# Patient Record
Sex: Female | Born: 1970 | Race: Black or African American | Hispanic: No | Marital: Single | Smoking: Never smoker
Health system: Southern US, Community
[De-identification: ages and names within clinical notes are randomized; demographics above are authoritative.]

## PROBLEM LIST (undated history)

## (undated) DIAGNOSIS — B019 Varicella without complication: Secondary | ICD-10-CM

## (undated) DIAGNOSIS — I340 Nonrheumatic mitral (valve) insufficiency: Secondary | ICD-10-CM

## (undated) DIAGNOSIS — G43909 Migraine, unspecified, not intractable, without status migrainosus: Secondary | ICD-10-CM

## (undated) DIAGNOSIS — D500 Iron deficiency anemia secondary to blood loss (chronic): Secondary | ICD-10-CM

## (undated) DIAGNOSIS — T7840XA Allergy, unspecified, initial encounter: Secondary | ICD-10-CM

## (undated) DIAGNOSIS — Q2112 Patent foramen ovale: Secondary | ICD-10-CM

## (undated) DIAGNOSIS — D509 Iron deficiency anemia, unspecified: Principal | ICD-10-CM

## (undated) DIAGNOSIS — I071 Rheumatic tricuspid insufficiency: Secondary | ICD-10-CM

## (undated) DIAGNOSIS — Z531 Procedure and treatment not carried out because of patient's decision for reasons of belief and group pressure: Secondary | ICD-10-CM

## (undated) DIAGNOSIS — I639 Cerebral infarction, unspecified: Secondary | ICD-10-CM

## (undated) DIAGNOSIS — N946 Dysmenorrhea, unspecified: Secondary | ICD-10-CM

## (undated) DIAGNOSIS — R0602 Shortness of breath: Secondary | ICD-10-CM

## (undated) DIAGNOSIS — Q211 Atrial septal defect: Secondary | ICD-10-CM

## (undated) DIAGNOSIS — Z8673 Personal history of transient ischemic attack (TIA), and cerebral infarction without residual deficits: Secondary | ICD-10-CM

## (undated) DIAGNOSIS — IMO0001 Reserved for inherently not codable concepts without codable children: Secondary | ICD-10-CM

## (undated) HISTORY — DX: Varicella without complication: B01.9

## (undated) HISTORY — DX: Cerebral infarction, unspecified: I63.9

## (undated) HISTORY — DX: Personal history of transient ischemic attack (TIA), and cerebral infarction without residual deficits: Z86.73

## (undated) HISTORY — DX: Iron deficiency anemia, unspecified: D50.9

## (undated) HISTORY — DX: Allergy, unspecified, initial encounter: T78.40XA

## (undated) HISTORY — DX: Rheumatic tricuspid insufficiency: I07.1

## (undated) HISTORY — DX: Patent foramen ovale: Q21.12

## (undated) HISTORY — DX: Iron deficiency anemia secondary to blood loss (chronic): D50.0

## (undated) HISTORY — DX: Atrial septal defect: Q21.1

## (undated) HISTORY — DX: Nonrheumatic mitral (valve) insufficiency: I34.0

## (undated) HISTORY — DX: Migraine, unspecified, not intractable, without status migrainosus: G43.909

## (undated) HISTORY — DX: Dysmenorrhea, unspecified: N94.6

---

## 1999-05-18 ENCOUNTER — Other Ambulatory Visit: Admission: RE | Admit: 1999-05-18 | Discharge: 1999-05-18 | Payer: Self-pay | Admitting: Obstetrics & Gynecology

## 2000-07-10 ENCOUNTER — Emergency Department (HOSPITAL_COMMUNITY): Admission: EM | Admit: 2000-07-10 | Discharge: 2000-07-10 | Payer: Self-pay | Admitting: *Deleted

## 2002-03-13 ENCOUNTER — Other Ambulatory Visit: Admission: RE | Admit: 2002-03-13 | Discharge: 2002-03-13 | Payer: Self-pay | Admitting: Obstetrics and Gynecology

## 2003-09-04 ENCOUNTER — Other Ambulatory Visit: Admission: RE | Admit: 2003-09-04 | Discharge: 2003-09-04 | Payer: Self-pay | Admitting: Obstetrics and Gynecology

## 2004-04-29 ENCOUNTER — Encounter: Admission: RE | Admit: 2004-04-29 | Discharge: 2004-04-29 | Payer: Self-pay | Admitting: Obstetrics and Gynecology

## 2004-05-12 ENCOUNTER — Inpatient Hospital Stay (HOSPITAL_COMMUNITY): Admission: AD | Admit: 2004-05-12 | Discharge: 2004-05-14 | Payer: Self-pay | Admitting: Obstetrics and Gynecology

## 2006-02-22 ENCOUNTER — Other Ambulatory Visit: Admission: RE | Admit: 2006-02-22 | Discharge: 2006-02-22 | Payer: Self-pay | Admitting: Obstetrics and Gynecology

## 2010-01-29 ENCOUNTER — Observation Stay (HOSPITAL_COMMUNITY): Admission: EM | Admit: 2010-01-29 | Discharge: 2010-01-29 | Payer: Self-pay | Admitting: Emergency Medicine

## 2010-01-29 ENCOUNTER — Ambulatory Visit: Payer: Self-pay | Admitting: Cardiovascular Disease

## 2010-01-29 ENCOUNTER — Encounter (INDEPENDENT_AMBULATORY_CARE_PROVIDER_SITE_OTHER): Payer: Self-pay | Admitting: Emergency Medicine

## 2010-10-10 LAB — BASIC METABOLIC PANEL
BUN: 10 mg/dL (ref 6–23)
Chloride: 105 mEq/L (ref 96–112)
Creatinine, Ser: 0.86 mg/dL (ref 0.4–1.2)
GFR calc Af Amer: >60 mL/min (ref >60)
Potassium: 3.9 mEq/L (ref 3.5–5.1)

## 2010-10-10 LAB — DIFFERENTIAL
Basophils Absolute: 0 10*3/uL (ref 0.0–0.1)
Eosinophils Relative: 3 % (ref 0–5)
Lymphocytes Relative: 37 % (ref 12–46)
Lymphs Abs: 2.2 10*3/uL (ref 0.7–4.0)
Neutrophils Relative %: 52 % (ref 43–77)

## 2010-10-10 LAB — POCT CARDIAC MARKERS
CKMB, poc: <1 ng/mL — ABNORMAL LOW (ref 1.0–8.0)
CKMB, poc: <1 ng/mL — ABNORMAL LOW (ref 1.0–8.0)
Myoglobin, poc: 24.2 ng/mL (ref 12–200)
Myoglobin, poc: 38.9 ng/mL (ref 12–200)

## 2010-10-10 LAB — CBC
Hemoglobin: 11.5 g/dL — ABNORMAL LOW (ref 12.0–15.0)
MCV: 78.2 fL (ref 78.0–100.0)
RBC: 4.61 MIL/uL (ref 3.87–5.11)
RDW: 15.6 % — ABNORMAL HIGH (ref 11.5–15.5)
WBC: 5.8 10*3/uL (ref 4.0–10.5)

## 2010-10-19 ENCOUNTER — Encounter (INDEPENDENT_AMBULATORY_CARE_PROVIDER_SITE_OTHER): Payer: BC Managed Care – PPO | Admitting: Surgery

## 2010-10-19 DIAGNOSIS — Q211 Atrial septal defect: Secondary | ICD-10-CM

## 2010-10-20 NOTE — Consult Note (Signed)
NEW PATIENT CONSULTATION  Morgan Shaffer, Morgan Shaffer DOB:  [birth year]                                        October 19, 2010 CHART #:  [id number]  REASON FOR CONSULTATION:  PFO or ASD with history of TIAs.  CLINICAL HISTORY:  I was asked by Dr. Wynelle Link to evaluate the patient for treatment options of a possible small PFO or ASD with a history of 2 prior TIAs.  She is a 40 year old Jehovah's Witness who reports a history of migraine headaches since she was about 40 years old that had been worse over the past few years, occurring about once a month.  She reports that in May 2010 she had a TIA while sitting at the table with her family at dinner.  She was trying to drink from a glass and the liquid was pouring down over her chin.  She said she had difficulty speaking and tingling and numbness in her left upper and lower extremity.  She said she tried to get some words out to tell her daughter to call 911, but her speech was garbled.  Her symptoms gradually improved.  She had a second episode in March 2011 which was similar.  She was evaluated with a head CT at that time which apparently showed a small right cerebellar infarct.  Since then she said that she has had occasional episodes when she has not felt quite right but can put her finger on it.  She says she has pulled over while driving her car a few times with some tingling on the left side of her body.  She was seen in February by Dr. Wynelle Link for a new patient annual health review and was sent for an echocardiogram.  This showed normal left ventricular size and function.  Ejection fraction was 55%.  There was mild mitral regurgitation and mild tricuspid regurgitation.  There was no aortic stenosis or insufficiency.  The right and left atrium were of normal size.  There appeared to be a small PFO or ASD with a positive contrast bubble study.  REVIEW OF SYSTEMS:  GENERAL:  She denies any fever or chills.  She denies any recent  weight changes.  She denies fatigue. EYES:  She wears contacts.  She has had no recent visual changes. ENT:  She does have some ringing in her ears at times.  She has had no hearing loss. ENDOCRINE:  She denies diabetes and hypothyroidism. CARDIOVASCULAR:  She does have occasional chest discomfort but is vague. It has not been related to exertion.  She denies dyspnea on exertion. She has had no PND or orthopnea.  She denies peripheral edema and palpitations. RESPIRATORY:  She denies cough and sputum production. GI:  She has had no nausea or vomiting.  She denies melena and bright red blood per rectum. GU:  She denies dysuria and hematuria. MUSCULOSKELETAL:  She denies arthralgias and myalgias. VASCULAR:  She denies any claudication or phlebitis.  She never had DVT. NEUROLOGIC:  As above.  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  She denies any ongoing weakness or numbness.  She denies any visual changes. MUSCULOSKELETAL:  She denies arthralgias and myalgias. HEMATOLOGIC:  She denies any bleeding disorders or easy bleeding.  ALLERGIES:  ASPIRIN which causes difficulty breathing and tongue swelling.  PRESCRIBED MEDICATIONS: 1. Lopressor  12.5 mg b.i.d. 2. Plavix 75 mg daily. 3. Lipitor 10 mg daily. She said that she stopped all of these medications due to suspected side effects.  PAST MEDICAL HISTORY:  Significant for asthma.  She has a history of migraine headaches as noted above.  She has history of TIA and stroke as noted above.  She has never had any surgery.  SOCIAL HISTORY:  She is Jehovah Witness.  She is married and has 2 children.  She has never smoked.  She rarely drinks alcohol.  She denies any drug use.  FAMILY HISTORY:  Positive for hypertension.  PHYSICAL EXAMINATION:  VITAL SIGNS:  Her blood pressure is 134/90, pulse 78 and regular, respiratory rate is 14 and unlabored.  Oxygen saturation on room air is 99%.  GENERAL:  She is a  well-developed [place] American woman in no distress.  HEENT:  Normocephalic and atraumatic.  Pupils are equal and reactive to light and accommodation.  Extraocular muscles are intact.  Oropharynx is clear.  NECK:  Normal carotid pulses bilaterally. There are no bruits.  There is no adenopathy or thyromegaly.  CARDIAC: Regular rate and rhythm with normal S1 and S2.  There is no murmur, rub, or gallop.  LUNGS:  Clear.  ABDOMEN:  Active bowel sounds.  Abdomen is soft and nontender.  There are no palpable masses or organomegaly. EXTREMITIES:  No peripheral edema.  Pedal pulses are palpable bilaterally.  SKIN:  Warm and dry.  NEUROLOGIC:  Alert and oriented x3. Motor and sensory exams are grossly normal.  Recent laboratory examination shows a hemoglobin of 9.0 with a hematocrit 28.9 and platelet count 357,000.  Electrolytes were normal with BUN of 8 and creatinine 0.86.  IMPRESSION:  The patient small patent foramen ovale or atrial septal defect with a positive bubble study on 2-D echocardiogram with a history of 2 prior transient ischemic attacks and prior stroke noted on CT scan in March 2011.  She also has a history of migraine headaches since as teenager that had been worse over the past few years.  I think that further workup and treatment for possible patent foramen ovale or atrial septal defect is indicated given her history.  We will obtain a transesophageal echocardiogram to further evaluate the interatrial septum and allow precise determination of whether this is a patent foramen ovale or ventricular septal defect and its measurements as well as measurement of the surrounding septum.  I have scheduled this with Dr. Donato Schultz.  Depending on the results of the TEE, I will consider referring her to Dr. [name] for consideration of percutaneous closure of the patent foramen ovale or atrial septal defect.  If she is not a candidate for percutaneous procedure, then she can be  referred to Dr. Cornelius Moras for evaluation for surgical closure by minimally invasive mini right thoracotomy approach.  I discussed all of these with the patient in detail and answered all of her questions and she is in full agreement with that plan.  I will plan to see her back in the office after her TEE is completed.  Evelene Croon, M.D. Electronically Signed  BB/MEDQ  D:  10/19/2010  T:  10/20/2010  Job:  [id number]  cc:   Deatra James, M.D. Jake Bathe, MD

## 2010-10-26 ENCOUNTER — Ambulatory Visit (HOSPITAL_COMMUNITY)
Admission: RE | Admit: 2010-10-26 | Discharge: 2010-10-26 | Disposition: A | Discharge status: Home / Self Care | Payer: BC Managed Care – PPO | Source: Ambulatory Visit | Attending: Cardiology | Admitting: Cardiology

## 2010-10-26 DIAGNOSIS — I6350 Cerebral infarction due to unspecified occlusion or stenosis of unspecified cerebral artery: Secondary | ICD-10-CM | POA: Insufficient documentation

## 2010-10-26 DIAGNOSIS — I059 Rheumatic mitral valve disease, unspecified: Secondary | ICD-10-CM | POA: Insufficient documentation

## 2010-10-26 DIAGNOSIS — I079 Rheumatic tricuspid valve disease, unspecified: Secondary | ICD-10-CM | POA: Insufficient documentation

## 2010-11-09 ENCOUNTER — Ambulatory Visit: Payer: BC Managed Care – PPO | Admitting: Surgery

## 2010-11-12 ENCOUNTER — Ambulatory Visit (INDEPENDENT_AMBULATORY_CARE_PROVIDER_SITE_OTHER): Payer: BC Managed Care – PPO | Admitting: Surgery

## 2010-11-12 DIAGNOSIS — Q211 Atrial septal defect: Secondary | ICD-10-CM

## 2010-11-13 NOTE — Assessment & Plan Note (Signed)
OFFICE VISIT  Morgan Shaffer, Morgan Shaffer DOB:  1971/04/30                                        November 12, 2010 CHART #:  [id number]  The patient returned to my office today for followup following a transesophageal echocardiogram to further evaluate a PFO or ASD with a history of 2 prior TIAs and a history of prior stroke documented on head CT.  Please see my initial consultation from October 19, 2010, for her full history.  She underwent a transesophageal echocardiogram on April 3 by Dr. Anne Fu.  I have reviewed this.  It does show a small PFO with a positive bubble contrast study with a small amount of bubble contrast passing through the PFO only with coughing.  There was no evidence of atrial septal defect.  There was mild mitral regurgitation and no other abnormalities noted.  There were no vegetations noted and no intracardiac thrombus.  I reviewed the results with her.  I discussed the potential options of percutaneous closure by Interventional Cardiology as well as minimally invasive surgical closure.  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  I will refer her to Dr. [name] for evaluation for percutaneous closure.  If he feels that this is not possible or advisable, then she could be reevaluated for minimally invasive surgical closure.  Given her history of 2 prior TIAs and documented cerebellar stroke on CT scan with a positive bubble study, I think it would be best to pursue closure of this PFO.  I discussed all of these with her and she is in agreement.  Evelene Croon, M.D. Electronically Signed  BB/MEDQ  D:  11/12/2010  T:  11/13/2010  Job:  [id number]  cc:   Deatra James, M.D. Veverly Fells. Excell Seltzer, MD

## 2010-11-23 ENCOUNTER — Encounter: Payer: Self-pay | Admitting: Cardiovascular Disease

## 2010-11-24 ENCOUNTER — Encounter: Payer: Self-pay | Admitting: Cardiovascular Disease

## 2010-11-24 ENCOUNTER — Ambulatory Visit (INDEPENDENT_AMBULATORY_CARE_PROVIDER_SITE_OTHER): Payer: BC Managed Care – PPO | Admitting: Cardiovascular Disease

## 2010-11-24 VITALS — BP 110/68 | HR 67 | Resp 18 | Ht 65.0 in | Wt 157.0 lb

## 2010-11-24 DIAGNOSIS — Q211 Atrial septal defect: Secondary | ICD-10-CM

## 2010-11-24 DIAGNOSIS — Q2112 Patent foramen ovale: Secondary | ICD-10-CM

## 2010-11-24 NOTE — Assessment & Plan Note (Addendum)
The patient's transesophageal echo images were reviewed. She has a tunneled PFO and prominent eustachian valve. The eustachian valve can direct flow across the foramen ovale and has been implicated in orthodeoxia-platypnea syndrome.  The patient's neurologic events were very compelling for TIAs and she also had radiographic evidence of such with an abnormal CT of the brain. In a young patient without other risk factors for stroke, especially with recurrent events, I think transcatheter closure is indicated. I reviewed the risks, indications, and alternatives with the patient and she would like to proceed. She understands that she will have to take Plavix for a period of 6 months. Risks include bleeding, infection, device embolization, stroke, MI, cardiac perforation and tamponade, and late erosion.  These were all reviewed in detail and the patient understands.  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  She understands this additional risk.

## 2010-11-24 NOTE — Patient Instructions (Signed)
Your physician recommends PFO closure.  Our office will contact you with instructions.

## 2010-11-24 NOTE — Progress Notes (Signed)
HPI:  This is a 40 year old woman referred for consideration of transcatheter PFO closure. The patient has a long-standing history of migraine headaches, but she never had significant neurologic symptoms until approximately 2 years ago when she developed the acute onset of expressive aphasia and left-sided weakness. She was sitting at the table eating dinner when she went to take a drink from her cup. Her drink spilled down from her mouth. She went to call out to her daughter but was unable to speak. She also had left arm weakness.  The total duration of symptoms was approximately 5-10 minutes. The patient was hospitalized and told that she may have had a complicated migraine. She sought second neurologist who brought up the possibility of a PFO as he thought her symptoms were more likely related to a TIA. The patient took Plavix for a period of about 6 months but then discontinued all of her medications because of several side effects. She has previously been unable to take aspirin because of an aspirin allergy. She then had a second episode in March 2011. She reports similar symptoms when she had aphasia and left-sided tingling and weakness again.  At that time a CT of the brain showed a small right cerebellar infarct. She ultimately underwent an echo with bubble study that demonstrated a right to left shunt.  The patient was referred to Dr. Lavinia Sharps with cardiac surgery and after evaluation he ordered a transesophageal echo. This demonstrated normal left ventricular size and systolic function with an ejection fraction of 65-70%. There was a tunneled atrial septal communication consistent with a PFO and right to left shunt was seen with Valsalva. On my review of the films there also appears to be a prominent eustachian valve.  The patient has been off of all medications. She has had no further TIA symptoms since last year. She would like to avoid long-term medication if at all possible and she has a strong desire  for closure of her atrial septal communication.  Outpatient Encounter Prescriptions as of 11/24/2010  Medication Sig Dispense Refill  . Multiple Vitamin (MULTIVITAMIN) tablet Take 1 tablet by mouth daily.        Marland Kitchen DISCONTD: atorvastatin (LIPITOR) 10 MG tablet Take 10 mg by mouth daily.        Marland Kitchen DISCONTD: clopidogrel (PLAVIX) 75 MG tablet Take 75 mg by mouth daily.        Marland Kitchen DISCONTD: metoprolol tartrate (LOPRESSOR) 25 MG tablet Take by mouth. Take 1/2 tablet twice a day         Aspirin and Lisinopril  Past Medical History  Diagnosis Date  . History of TIAs   . Stroke   . PFO (patent foramen ovale)   . Mitral regurgitation     mild  . Migraine headache   . Mild tricuspid regurgitation   . Asthma     No past surgical history on file.  History   Social History  . Marital Status: Single    Spouse Name: N/A    Number of Children: 2  . Years of Education: N/A   Occupational History  .        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   Social History Main Topics  . Smoking status: Never Smoker   . Smokeless tobacco: Not on file  . Alcohol Use: No  . Drug Use: No  . Sexually Active: Not on file   Other Topics Concern  . Not on file   Social History Narrative  . No narrative  on file    Family History  Problem Relation Age of Onset  . Hypertension      positive family hx of    ROS: General: no fevers/chills/night sweats Eyes: no blurry vision, diplopia, or amaurosis ENT: no sore throat or hearing loss Resp: no cough, wheezing, or hemoptysis CV: no edema or palpitations GI: no abdominal pain, nausea, vomiting, diarrhea, or constipation GU: no dysuria, frequency, or hematuria Skin: no rash Neuro: no headache, numbness, tingling, or weakness of extremities Musculoskeletal: no joint pain or swelling Heme: no bleeding, DVT, or easy bruising Endo: no polydipsia or polyuria  BP 110/68  Pulse 67  Resp 18  Ht 5\' 5"  (1.651 m)  Wt 157 lb (71.215 kg)  BMI 26.13 kg/m2  PHYSICAL  EXAM: Pt is alert and oriented, healthy appearing young woman, WD, WN, in no distress. HEENT: normal Neck: JVP normal. Carotid upstrokes normal without bruits. No thyromegaly. Lungs: equal expansion, clear bilaterally CV: Apex is discrete and nondisplaced, RRR without murmur or gallop Abd: soft, NT, +BS, no bruit, no hepatosplenomegaly Back: no CVA tenderness Ext: no C/C/E        Femoral pulses 2+= without bruits        DP/PT pulses intact and = Skin: warm and dry without rash Neuro: CNII-XII intact             Strength intact = bilaterally  EKG:  Normal sinus rhythm 69 beats per minute, within normal limits.  ASSESSMENT AND PLAN:
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
## 2010-12-27 ENCOUNTER — Telehealth: Payer: Self-pay | Admitting: Cardiovascular Disease

## 2010-12-27 NOTE — Telephone Encounter (Signed)
The patient's PFO closure has been denied by her insurance. I did a peer-to-peer review today with [employer] and will be writing a letter of appeal. I notified the patient by telephone and we discussed the situation. She understands and will do an appeal as well.

## 2011-01-10 ENCOUNTER — Encounter: Payer: Self-pay | Admitting: Cardiovascular Disease

## 2011-03-22 ENCOUNTER — Inpatient Hospital Stay (HOSPITAL_COMMUNITY): Payer: BC Managed Care – PPO

## 2011-03-22 ENCOUNTER — Emergency Department (HOSPITAL_COMMUNITY): Payer: BC Managed Care – PPO

## 2011-03-22 ENCOUNTER — Inpatient Hospital Stay (HOSPITAL_COMMUNITY)
Admission: EM | Admit: 2011-03-22 | Discharge: 2011-03-24 | DRG: 832 | Disposition: A | Discharge status: Home / Self Care | Payer: BC Managed Care – PPO | Attending: Internal Medicine | Admitting: Internal Medicine

## 2011-03-22 DIAGNOSIS — D509 Iron deficiency anemia, unspecified: Secondary | ICD-10-CM | POA: Diagnosis present

## 2011-03-22 DIAGNOSIS — Z886 Allergy status to analgesic agent status: Secondary | ICD-10-CM

## 2011-03-22 DIAGNOSIS — Z8673 Personal history of transient ischemic attack (TIA), and cerebral infarction without residual deficits: Secondary | ICD-10-CM

## 2011-03-22 DIAGNOSIS — G459 Transient cerebral ischemic attack, unspecified: Principal | ICD-10-CM | POA: Diagnosis present

## 2011-03-22 DIAGNOSIS — Q211 Atrial septal defect: Secondary | ICD-10-CM

## 2011-03-22 DIAGNOSIS — Q2111 Secundum atrial septal defect: Secondary | ICD-10-CM

## 2011-03-22 LAB — URINALYSIS, ROUTINE W REFLEX MICROSCOPIC
Bilirubin Urine: NEGATIVE
Glucose, UA: NEGATIVE mg/dL
Protein, ur: NEGATIVE mg/dL
Specific Gravity, Urine: 1.015 (ref 1.005–1.030)

## 2011-03-22 LAB — TROPONIN I
Troponin I: <0.3 ng/mL (ref <0.30)
Troponin I: <0.3 ng/mL (ref <0.30)

## 2011-03-22 LAB — RAPID URINE DRUG SCREEN, HOSP PERFORMED
Amphetamines: NOT DETECTED
Barbiturates: NOT DETECTED
Benzodiazepines: NOT DETECTED
Cocaine: NOT DETECTED

## 2011-03-22 LAB — APTT
aPTT: 31 seconds (ref 24–37)
aPTT: 27 seconds (ref 24–37)

## 2011-03-22 LAB — CBC
HCT: 29.2 % — ABNORMAL LOW (ref 36.0–46.0)
Hemoglobin: 8.5 g/dL — ABNORMAL LOW (ref 12.0–15.0)
MCH: 18.6 pg — ABNORMAL LOW (ref 26.0–34.0)
MCHC: 29.5 g/dL — ABNORMAL LOW (ref 30.0–36.0)
MCV: 62.8 fL — ABNORMAL LOW (ref 78.0–100.0)
Platelets: 302 10*3/uL (ref 150–400)
RBC: 4.62 MIL/uL (ref 3.87–5.11)
RBC: 4.62 MIL/uL (ref 3.87–5.11)
RDW: 20.2 % — ABNORMAL HIGH (ref 11.5–15.5)
WBC: 5.2 10*3/uL (ref 4.0–10.5)
WBC: 5.5 10*3/uL (ref 4.0–10.5)

## 2011-03-22 LAB — PROTIME-INR
INR: 1.08 (ref 0.00–1.49)
Prothrombin Time: 14.2 seconds (ref 11.6–15.2)
Prothrombin Time: 14.4 seconds (ref 11.6–15.2)

## 2011-03-22 LAB — HEMOGLOBIN A1C: Hgb A1c MFr Bld: 6 % — ABNORMAL HIGH (ref <5.7)

## 2011-03-22 LAB — POCT I-STAT, CHEM 8
BUN: 7 mg/dL (ref 6–23)
Glucose, Bld: 97 mg/dL (ref 70–99)
HCT: 32 % — ABNORMAL LOW (ref 36.0–46.0)
Hemoglobin: 10.9 g/dL — ABNORMAL LOW (ref 12.0–15.0)
Sodium: 141 mEq/L (ref 135–145)

## 2011-03-22 LAB — COMPREHENSIVE METABOLIC PANEL
ALT: 6 U/L (ref 0–35)
AST: 14 U/L (ref 0–37)
Alkaline Phosphatase: 116 U/L (ref 39–117)
CO2: 21 mEq/L (ref 19–32)
GFR calc Af Amer: >60 mL/min (ref >60)
GFR calc non Af Amer: >60 mL/min (ref >60)
Glucose, Bld: 91 mg/dL (ref 70–99)
Potassium: 4 mEq/L (ref 3.5–5.1)
Sodium: 138 mEq/L (ref 135–145)

## 2011-03-22 LAB — ETHANOL: Alcohol, Ethyl (B): <11 mg/dL (ref 0–11)

## 2011-03-22 NOTE — H&P (Signed)
NAMECADEY, BAZILE NO.:  [id number]  MEDICAL RECORD NO.:  [id number]  LOCATION:  [facility]                         FACILITY:  [facility]  PHYSICIAN:  Andreas Blower, MD       DATE OF BIRTH:  1971/02/12  DATE OF ADMISSION:  03/22/2011 DATE OF DISCHARGE:                             HISTORY & PHYSICAL   PRIMARY CARE PHYSICIAN:  Deatra James, MD  CARDIOTHORACIC SURGEON:  Evelene Croon, MD  CARDIOLOGIST:  Veverly Fells. Excell Seltzer, MD  CHIEF COMPLAINTS:  Right upper and lower extremity weakness, confusion.  HISTORY OF PRESENT ILLNESS:  Ms. Kuras is a 40 year old African American female with history of TIAs x2, patent foramen ovale, allergy to aspirin who presents with the above complaints.  She reported that her symptoms started around 10:30 a.m. with blurry vision in the left eye and some dizziness.  When she laid down to go to bed, she noted ringing in her right ear.  When she woke up this morning, she continued to have persistent ringing in a right ear, but was improved.  She also noted some numbness in her right scalp when she was brushing her hair this morning at 6:00 a.m. and also noted some weakness in her right upper and lower extremity.  She went to work and continued to have the symptoms with dizziness as a result presented to the ER for further evaluation. At around 10:30 a.m., her symptoms improved.  The patient had a head CT which was negative, but the hospitalist service was asked to admit the patient given that she has had TIA symptoms x2 a few years ago in [place], [place].  The patient moved from [place] to [place] in April 2011.  The patient has been seeing Dr. Laneta Simmers, Thoracic Surgery for consideration of closure of PFO, but has been delayed due to insurance reasons.  The patient denies any recent fevers, chills, nausea, vomiting.  Denies any chest pain, shortness of breath.  Denies any abdominal pain, diarrhea.  Denies any headaches or vision  changes presently.  REVIEW OF SYSTEMS:  All systems were reviewed with the patient, was positive as per HPI, otherwise all other systems are negative.  PAST MEDICAL HISTORY: 1. History of TIAs x2 a few years ago. 2. 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 3. Questionable history of hypertension, was started on     antihypertensive medication after first TIA; however, was     discontinued after the second TIA. 4. Questionable history of hyperlipidemia, was started on statin after     the first TIAs, but was continued after the second TIA. 5. Patent foramen ovale, in the process of being evaluated by Dr.     Laneta Simmers for closure of PFO.  SOCIAL HISTORY:  The patient does not smoke, drinks alcohol once or twice a month.  Lives at home with children.  Works in the accounts payable.  FAMILY HISTORY:  Mother had a arrhythmia, otherwise no significant family history.  HOME MEDICATIONS:  Currently not taking any medications at this time.  PHYSICAL EXAMINATION:  VITAL SIGNS:  Temperature 98.4, blood pressure 138/86, heart rate 87, respirations 18, satting 100% on room air. GENERAL:  The patient was alert,  oriented, not in acute distress, was lying in bed comfortably. HEENT:  Extraocular motions are intact.  Pupils are equal, round.  Had moist mucous membranes. NECK:  Supple. HEART:  Regular with S1 and S2. LUNGS:  Clear to auscultation bilaterally. ABDOMEN: Soft, nontender, nondistended.  Positive bowel sounds. EXTREMITIES:  The patient had good peripheral pulses with trace edema. NEUROLOGIC:  Cranial nerves II through XII grossly intact, had 5/5 motor strength in upper as well as lower extremities.  RADIOLOGY/IMAGING:  The patient had head CT which was normal, head CT without contrast.  LABORATORY DATA:  CBC shows a white count of 5.2, hemoglobin 10.9, hematocrit 32.0, platelet count 302.  Electrolytes normal with a BUN of 7, creatinine 0.80.  Initial troponin less than 0.30.  UA is  negative for nitrites and leukocytes.  Blood alcohol level less than 11.  Urine pregnancy is negative.  ASSESSMENT AND PLAN: 1. Transient ischemic attack suspect that this is a recurrence of her     TIA.  We will get an MRI, MRA.  Will get a 2-D echocardiogram,     carotid Dopplers as part of TIA workup.  We will start the patient     on Plavix as she reports that she herself has discontinued Plavix     about a year ago in the past.  Probably, we will need to contact     Neurology to determine if Plavix should be sufficient for the     patient at the time of discharge.  This should be sufficient at the     time of discharge if the workup is negative.  We will not have her     on any aspirin as she has had anaphylaxis from aspirin usage. 2. Questionable diagnosis of hypertension, currently not on any     hypertensive medications and monitor her blood pressure during the     course of hospital stay.  We will allow permissive hypertension for     the next 24 hours.  If the patient has persistent elevated blood     pressure, may benefit from being on antihypertensive medications. 3. Questionable diagnosis of hyperlipidemia.  We will send for lipid     panel in the morning. 4. Patent foramen ovale.  Further management as outpatient under the     care of Dr. Laneta Simmers for consideration of closure. 5. Anemia.  We will send for anemia panel. 6. Prophylaxis, Lovenox for DVT prophylaxis. 7. Code status.  The patient is full code.  Time spent on admission, talking to the patient, coordinating care was 45 minutes.   Addendum: Spoke with neurology, if the workup is negative could be discharge on plavix.   Andreas Blower, MD   SR/MEDQ  D:  03/22/2011  T:  03/22/2011  Job:  [id number]  Electronically Signed by Wardell Heath Glennda Weatherholtz  on 03/22/2011 11:47:24 PM

## 2011-03-23 DIAGNOSIS — G459 Transient cerebral ischemic attack, unspecified: Secondary | ICD-10-CM

## 2011-03-23 LAB — FOLATE: Folate: 11 ng/mL

## 2011-03-23 LAB — LIPID PANEL
LDL Cholesterol: 89 mg/dL (ref 0–99)
Triglycerides: 103 mg/dL (ref <150)
VLDL: 21 mg/dL (ref 0–40)

## 2011-03-23 LAB — BASIC METABOLIC PANEL
BUN: 10 mg/dL (ref 6–23)
CO2: 21 mEq/L (ref 19–32)
Calcium: 9.3 mg/dL (ref 8.4–10.5)
Creatinine, Ser: 0.81 mg/dL (ref 0.50–1.10)
GFR calc non Af Amer: >60 mL/min (ref >60)
Glucose, Bld: 97 mg/dL (ref 70–99)
Sodium: 137 mEq/L (ref 135–145)

## 2011-03-23 LAB — CBC
Hemoglobin: 8.4 g/dL — ABNORMAL LOW (ref 12.0–15.0)
MCH: 18.1 pg — ABNORMAL LOW (ref 26.0–34.0)
MCHC: 28.9 g/dL — ABNORMAL LOW (ref 30.0–36.0)
MCV: 62.9 fL — ABNORMAL LOW (ref 78.0–100.0)
RBC: 4.63 MIL/uL (ref 3.87–5.11)

## 2011-03-23 LAB — IRON AND TIBC: Iron: 11 ug/dL — ABNORMAL LOW (ref 42–135)

## 2011-03-23 LAB — VITAMIN B12: Vitamin B-12: 616 pg/mL (ref 211–911)

## 2011-03-23 LAB — FERRITIN: Ferritin: 5 ng/mL — ABNORMAL LOW (ref 10–291)

## 2011-03-24 LAB — BASIC METABOLIC PANEL
BUN: 11 mg/dL (ref 6–23)
Creatinine, Ser: 0.71 mg/dL (ref 0.50–1.10)
GFR calc Af Amer: >60 mL/min (ref >60)
GFR calc non Af Amer: >60 mL/min (ref >60)
Glucose, Bld: 97 mg/dL (ref 70–99)
Potassium: 3.9 mEq/L (ref 3.5–5.1)

## 2011-03-24 LAB — CBC
HCT: 30.9 % — ABNORMAL LOW (ref 36.0–46.0)
Hemoglobin: 9.1 g/dL — ABNORMAL LOW (ref 12.0–15.0)
MCH: 18.7 pg — ABNORMAL LOW (ref 26.0–34.0)
MCHC: 29.4 g/dL — ABNORMAL LOW (ref 30.0–36.0)
MCV: 63.6 fL — ABNORMAL LOW (ref 78.0–100.0)
RDW: 20.4 % — ABNORMAL HIGH (ref 11.5–15.5)

## 2011-03-31 NOTE — Discharge Summary (Signed)
Morgan Shaffer, Morgan Shaffer NO.:  [id number]  MEDICAL RECORD NO.:  [id number]  LOCATION:  3032                         FACILITY:  [facility]  PHYSICIAN:  Ramiro Harvest, MD    DATE OF BIRTH:  01/06/1971  DATE OF ADMISSION:  03/22/2011 DATE OF DISCHARGE:  03/24/2011                        DISCHARGE SUMMARY    PRIMARY CARE PHYSICIAN:  Dr. Naida Sleight with [facility].  CARDIOTHORACIC SURGEON:  Evelene Croon, MD  DISCHARGE DIAGNOSES: 1. Transient ischemic attack, symptoms resolved. 2. Iron-deficiency anemia. 3. Patent foraminal valve in the process of being evaluated by Dr.     Laneta Simmers for closure of patent foramen ovale. 4. History of transient ischemic attack x2. 5. History of anaphylactic reaction to aspirin. 6. History of remote very small right cerebellar infarct per MRI of     March 23, 2011.  DISCHARGE MEDICATIONS: 1. Plavix 75 mg p.o. daily. 2. Nu-Iron 150 mg p.o. daily. 3. Pravachol 20 mg p.o. daily. 4. Zofran 4 mg 1-2 tablets p.o. q.4 h. P.r.n. 5. Multivitamin 1 tablet p.o. daily. 6. Tylenol 325 mg 1-2 tablets p.o. q.6 h. p.r.n. pain.  DISPOSITION AND FOLLOWUP:  The patient will be discharged home on Plavix.  The patient does not have any PT, OT needs.  The patient is to follow up with PCP 1 week post discharge.  On follow up, a CBC will need to be checked and followup on the patient's H and H.  The patient is also to follow up with Dr. Laneta Simmers for closure of PFO.  The patient has started on Nu-Iron 150 mg daily secondary to iron-deficiency anemia and this will need to be followed up upon per PCP.  CONSULTATIONS DONE:  None.  PROCEDURES PERFORMED:  CT of the head without contrast was done March 22, 2011 that showed normal CT of the head without contrast.  MRI, MRA of the head was done March 23, 2011 that showed no acute infarct remote, very small right cerebellar infarct.  No evidence of medium or large size vessel significant narrowing or  occlusion as noted above.  A carotid Dopplers were done on March 23, 2011 that showed no significant extracranial carotid artery stenosis.  Vertebrals are patent with antegrade flow.  A 2-D echo was done on March 23, 2011 that showed a normal left ventricular cavity size, systolic function was normal, EF 55- 60%, wall motion was normal.  There was no regional wall motion abnormalities.  No cardiac source of emboli was identified.  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  The patient reported that her symptoms started around 7:30 a.m. with some blurry vision in the left eye with some associated dizziness.  When she laid down to go to bed, she noted ringing in her right ear.  She woke up on the morning of admission, continued to have persistent ringing in the right ear which improved.  The patient also noted some numbness in the right scab when she was brushing her hair on the morning at 6 a.m., and also  noted some weakness in the right upper extremity and lower extremity.  The patient went to work, continued to have her symptoms with dizziness and thus presented to the ED for further evaluation.  Around 10:30 a.m., her symptoms had improved.  The patient had CT of the head which was negative.  Hospitalist service was asked to admit the patient given that she had TIA symptoms x2 a few years ago in [place], [place].  The patient moved from [place] to [place] in April 2011.  The patient had been seeing Dr. Laneta Simmers of Thoracic Surgery for consideration of closure of a PFO, but been delayed due to insurance reasons.  The patient denied any fevers, chills, nausea, vomiting, chest pain, shortness of breath, abdominal pain, diarrhea or headaches or visual changes.  For the rest of admission history  and physical, please see H and P dictated by Dr. Betti Cruz of job [phone].  HOSPITAL COURSE: 1. TIA.  The patient was admitted for workup of her TIA.  MRI, MRA of     the head was obtained, results as stated above which was negative     for any acute infarct.  The patient was started on Plavix due to     aspirin allergy.  A 2-D echo was obtained, results as stated above     which was negative for any cardiac source of emboli.  Carotid     Dopplers were also done which did not have any significant     stenosis.  PT, OT was not needed as the patient's symptoms improved     rapidly and had resolved by day of discharge.  The patient's     presentation was discussed with admitting physician with     neurologist on call and it was recommended that a workup was     negative.  The patient was stable to be discharged on aspirin.  The     patient's workup has been negative so far.  The patient has     improved clinically and symptomatically and is back to her     baseline.  She will be discharged to home on aspirin daily and     needs to follow up with PCP 1 week post discharge.  The patient     will be discharged in stable and improved condition.  Lipid panel     was obtained.  The patient did have a total cholesterol of 167,     triglycerides of 103, HDL of 57 and LDL of 89.  Due to the     patient's history of prior TIAs and small cerebellar infarct, the     patient has been started on low-dose Pravachol for goal LDL of less     than 70 which will need to be followed up upon per her PCP. 2. Iron-deficiency anemia.  The patient was noted to be anemic;     however, the patient does have emesis.  Anemia panel which was     obtained, did have iron level of 11, a B12 of 616 and folate of 11     and a ferritin of 5.  The patient's hemoglobin remained stable and     was 9.1 on day of discharge.  The patient will be discharged home     on oral Nu-Iron and will need close followup with PCP as an      outpatient.  The rest of the patient's chronic medical issues     remained stable throughout  the hospitalization and the patient will     be discharged in stable and improved condition.  On day of discharge vital signs; temperature 98.1, pulse of 70, respirations 16, blood pressure 109/75, satting 100% on room air.  DISCHARGE LABORATORY DATA:  Sodium 134, potassium 3.9, chloride 102, bicarb 20, glucose 97, BUN 11, creatinine 0.71, calcium of 9.3.  CBC with a white count of 4.3, hemoglobin 9.1, hematocrit 30.9 and a platelet count of 296.  It has been a pleasure taking care of Morgan Shaffer.     Ramiro Harvest, MD     DT/MEDQ  D:  03/24/2011  T:  03/24/2011  Job:  [id number]  cc:   Evelene Croon, M.D. Veverly Fells. Excell Seltzer, MD  Electronically Signed by Ramiro Harvest MD on 03/31/2011 12:21:45 PM

## 2011-07-11 ENCOUNTER — Ambulatory Visit: Payer: BC Managed Care – PPO | Admitting: Internal Medicine

## 2011-08-16 ENCOUNTER — Ambulatory Visit: Payer: BC Managed Care – PPO | Admitting: Internal Medicine

## 2011-08-22 ENCOUNTER — Encounter: Payer: Self-pay | Admitting: Internal Medicine

## 2011-08-22 ENCOUNTER — Ambulatory Visit (INDEPENDENT_AMBULATORY_CARE_PROVIDER_SITE_OTHER): Payer: 59 | Admitting: Internal Medicine

## 2011-08-22 VITALS — BP 110/74 | HR 100 | Temp 98.3°F | Wt 163.0 lb

## 2011-08-22 DIAGNOSIS — Q211 Atrial septal defect: Secondary | ICD-10-CM

## 2011-08-22 DIAGNOSIS — Q2111 Secundum atrial septal defect: Secondary | ICD-10-CM

## 2011-08-22 DIAGNOSIS — Z0000 Encounter for general adult medical examination without abnormal findings: Secondary | ICD-10-CM

## 2011-08-22 MED ORDER — CLOPIDOGREL BISULFATE 75 MG PO TABS: 75.0000 mg | ORAL_TABLET | Freq: Every day | ORAL | Status: DC

## 2011-08-22 NOTE — Progress Notes (Signed)
Subjective:    Patient ID: Morgan Shaffer, female    DOB: 01/25/1971, 41 y.o.   MRN: [id number]  HPI  41 year old patient who is seen today to establish  with our practice. She has a history of PFO and has had  3 cerebral ischemic events the first in May of 2010. She was seen by Dr. Excell Seltzer in May of last year and PFO closure was advised.  She was hospitalized in August with a third CVA. At that time she presented with the tinnitus right occipital numbness and a receptive aphasia. She was discharged on Plavix (aspirin allergy) but is no longer taking at this point. She now wishes to consider transcatheter PFO closure.  Past medical history is fairly unremarkable;  she does have a history of migraine headaches allergic rhinitis and mild asthma. At the present time she takes no chronic medications. As mentioned she is hospitalized for CVAs in May 2000 and March 2011 and August 2012 Social history she is originally from [place]; high school graduate; accounts payable specialist. Nonsmoker Family history-  father's health unknown;  mother age 104 has cardiac dysrhythmia;  3 brothers with asthma one sister in good health      Review of Systems  Constitutional: Negative for fever, appetite change, fatigue and unexpected weight change.  HENT: Negative for hearing loss, ear pain, nosebleeds, congestion, sore throat, mouth sores, trouble swallowing, neck stiffness, dental problem, voice change, sinus pressure and tinnitus.   Eyes: Negative for photophobia, pain, redness and visual disturbance.  Respiratory: Negative for cough, chest tightness and shortness of breath.   Cardiovascular: Negative for chest pain, palpitations and leg swelling.  Gastrointestinal: Negative for nausea, vomiting, abdominal pain, diarrhea, constipation, blood in stool, abdominal distention and rectal pain.  Genitourinary: Negative for dysuria, urgency, frequency, hematuria, flank pain, vaginal bleeding, vaginal discharge, difficulty  urinating, genital sores, vaginal pain, menstrual problem and pelvic pain.  Musculoskeletal: Negative for back pain and arthralgias.  Skin: Negative for rash.  Neurological: Positive for headaches. Negative for dizziness, syncope, speech difficulty, weakness, light-headedness and numbness.  Hematological: Negative for adenopathy. Does not bruise/bleed easily.  Psychiatric/Behavioral: Negative for suicidal ideas, behavioral problems, self-injury, dysphoric mood and agitation. The patient is not nervous/anxious.        Objective:   Physical Exam  Constitutional: She is oriented to person, place, and time. She appears well-developed and well-nourished.  HENT:  Head: Normocephalic and atraumatic.  Right Ear: External ear normal.  Left Ear: External ear normal.  Mouth/Throat: Oropharynx is clear and moist.  Eyes: Conjunctivae and EOM are normal.  Neck: Normal range of motion. Neck supple. No JVD present. No thyromegaly present.  Cardiovascular: Normal rate, regular rhythm, normal heart sounds and intact distal pulses.   No murmur heard. Pulmonary/Chest: Effort normal and breath sounds normal. She has no wheezes. She has no rales.  Abdominal: Soft. Bowel sounds are normal. She exhibits no distension and no mass. There is no tenderness. There is no rebound and no guarding.  Musculoskeletal: Normal range of motion. She exhibits no edema and no tenderness.  Neurological: She is alert and oriented to person, place, and time. She has normal reflexes. No cranial nerve deficit. She exhibits normal muscle tone. Coordination normal.  Skin: Skin is warm and dry. No rash noted.  Psychiatric: She has a normal mood and affect. Her behavior is normal.          Assessment & Plan:   Preventive health examination Mild episodic asthma Allergic rhinitis History migraine  headaches PFO:   status post CVA x3. We'll refer back to Dr. Excell Seltzer for consideration of transcatheter PFO closure. We'll resume  Plavix.

## 2011-08-22 NOTE — Patient Instructions (Signed)
Follow up with Dr. Excell Seltzer as discussed  Call or return to clinic prn if these symptoms worsen or fail to improve as anticipated.  Plavix 75 mg daily

## 2011-08-23 ENCOUNTER — Telehealth: Payer: Self-pay | Admitting: Cardiovascular Disease

## 2011-08-23 NOTE — Telephone Encounter (Addendum)
I spoke with Aurther Loft and made her aware that we had tried to get the pt approved for PFO closure in the past but this was denied.  I did schedule the pt for an appointment with Dr Excell Seltzer at this time.  Dr Excell Seltzer aware and the pt should keep appointment since she had another event in August 2012.

## 2011-08-23 NOTE — Telephone Encounter (Signed)
Dr Lesia Hausen seeing [facility] of [facility] and wants pt set up for trans catheter closure of PFO , he wants to know if can go ahead and schedule since recently seen pt? pls call

## 2011-08-23 NOTE — Telephone Encounter (Signed)
This pt has not seen Dr Excell Seltzer since May 2012.  We attempted to get the approval from the pt's insurance for PFO closure but her insurance denied procedure. I left a message for Aurther Loft to call our office.

## 2011-09-13 ENCOUNTER — Ambulatory Visit (INDEPENDENT_AMBULATORY_CARE_PROVIDER_SITE_OTHER): Payer: 59 | Admitting: Cardiovascular Disease

## 2011-09-13 ENCOUNTER — Encounter: Payer: Self-pay | Admitting: Cardiovascular Disease

## 2011-09-13 VITALS — BP 130/82 | HR 71 | Ht 65.0 in | Wt 165.8 lb

## 2011-09-13 DIAGNOSIS — Q211 Atrial septal defect: Secondary | ICD-10-CM

## 2011-09-13 NOTE — Progress Notes (Signed)
HPI:  [name] presents for followup evaluation. She is a 41 year old woman with a PFO and history of recurrent stroke. I saw her in May 2012 for initial evaluation. At that time, she had 2 previous episodes associated with expressive aphasia and left-sided weakness. She was diagnosed with a TIA in 2010 and she took Plavix for about 6 months but discontinued it because of some side effects. The patient has a true aspirin allergy. She had a second Avantin 2011 again with expressive aphasia and left-sided weakness. At that time by the patient's report a CT scan of the brain showed a cerebellar infarct. She underwent a transesophageal echocardiogram as part of her post stroke workup and the echo demonstrated normal left ventricular size and systolic function. There was a tunneled patent foramen ovale present with right to left shunt as well as a prominent eustachian valve. When I saw the patient last year, we scheduled her for transcatheter PFO closure based on her 2 previous TIAs, and there was a somewhat protracted review with her insurance company. The procedure was initially denied, and I wrote a letter of medical necessity, and I spoke with one of the medical director's reviewing the case. The patient was lost to followup and she tells me now that the transcatheter PFO closure procedure was actually improved, but she was not aware of this until recently. My office never was informed of this approval.  The patient was hospitalized again in August 2012 with dizziness and severe right ear or tinnitus. She felt numbness in her right scalp behind the year and weakness in her right arm and leg. She also had difficulty understanding speech. Symptoms lasted 12-24 hours then resolved.  An MRI of the brain showed no acute infarction but did show a small right cerebellar infarct consistent with her old stroke.  She is now taking Plavix again and is tolerating it without significant bleeding problems. 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  Outpatient Encounter Prescriptions as of 09/13/2011  Medication Sig Dispense Refill  . clopidogrel (PLAVIX) 75 MG tablet Take 1 tablet (75 mg total) by mouth daily.  90 tablet  4  . Multiple Vitamin (MULTIVITAMIN) tablet Take 1 tablet by mouth daily.          Allergies  Allergen Reactions  . Aspirin   . Lisinopril     Past Medical History  Diagnosis Date  . History of TIAs   . Stroke   . PFO (patent foramen ovale)   . Mitral regurgitation     mild  . Migraine headache   . Mild tricuspid regurgitation   . Asthma   . Chicken pox   . Allergy     hay fever  . Stroke may 2010  . Mini stroke 09/2009 and 02/2011    ROS: Negative except as per HPI  BP 130/82  Pulse 71  Ht 5\' 5"  (1.651 m)  Wt 75.206 kg (165 lb 12.8 oz)  BMI 27.59 kg/m2  LMP 07/27/2011  PHYSICAL EXAM: Pt is alert and oriented, NAD HEENT: normal Neck: JVP - normal, carotids 2+= without bruits Lungs: CTA bilaterally CV: RRR without murmur or gallop Abd: soft, NT, Positive BS, no hepatomegaly Ext: no C/C/E, distal pulses intact and equal Skin: warm/dry no rash  EKG:  Normal sinus rhythm 71 beats per minute, within normal limits.  ASSESSMENT AND PLAN:

## 2011-09-13 NOTE — Patient Instructions (Addendum)
Your physician has recommended PFO closure.   Your physician recommends that you continue on your current medications as directed. Please refer to the Current Medication list given to you today.

## 2011-09-13 NOTE — Assessment & Plan Note (Signed)
The patient has a patent foramen ovale with multiple recurrent TIAs. Neuroimaging documented a cerebellar stroke in the past. 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 She is eager to have her PFO closed and is very concerned about recurrent TIA or stroke. I discussed with her in detail the risks, indications, and alternatives to transcatheter PFO closure. This has been previously reviewed. Risks include but are not limited to bleeding, infection, device embolization, stroke, myocardial infarction, cardiac perforation, tamponade, emergency cardiac surgery, and death. The risks of major complication is approximately 1%. She understands this and would like to proceed.

## 2011-09-19 ENCOUNTER — Other Ambulatory Visit: Payer: Self-pay | Admitting: Cardiovascular Disease

## 2011-09-29 ENCOUNTER — Telehealth: Payer: Self-pay | Admitting: Cardiovascular Disease

## 2011-09-29 NOTE — Telephone Encounter (Signed)
I spoke with the pt and made her aware that normally we have pt's remain out of work for about one week after procedure.  The pt said she has some days that she can take at work and she wanted to go ahead and put request in at work.

## 2011-09-29 NOTE — Telephone Encounter (Signed)
Pt needs info about PFO like how long she will be out of work and things like that

## 2011-09-30 ENCOUNTER — Ambulatory Visit (INDEPENDENT_AMBULATORY_CARE_PROVIDER_SITE_OTHER): Payer: 59 | Admitting: Obstetrics and Gynecology

## 2011-09-30 DIAGNOSIS — Z1151 Encounter for screening for human papillomavirus (HPV): Secondary | ICD-10-CM

## 2011-09-30 DIAGNOSIS — Z01419 Encounter for gynecological examination (general) (routine) without abnormal findings: Secondary | ICD-10-CM

## 2011-10-10 ENCOUNTER — Telehealth: Payer: Self-pay

## 2011-10-10 ENCOUNTER — Encounter (HOSPITAL_COMMUNITY): Payer: Self-pay | Admitting: Pharmacy Technician

## 2011-10-10 DIAGNOSIS — Q211 Atrial septal defect: Secondary | ICD-10-CM

## 2011-10-10 NOTE — Telephone Encounter (Signed)
Pt rtn call to lauren. pls call [phone]

## 2011-10-10 NOTE — Telephone Encounter (Signed)
I spoke with the pt and she will come into the office for pre-procedure labs tomorrow.

## 2011-10-10 NOTE — Telephone Encounter (Signed)
Left message for pt to call back.  Pt needs a BMP, CBC and PT/INR.    Hey Gavyn Ybarra - will you bring her in for preprocedure labs in the next day or two? She has had low H/H and I just want to make sure it's not too low for the procedure before bringing her into short stay. thx Kathlene November

## 2011-10-11 ENCOUNTER — Other Ambulatory Visit (INDEPENDENT_AMBULATORY_CARE_PROVIDER_SITE_OTHER): Payer: 59

## 2011-10-11 ENCOUNTER — Telehealth: Payer: Self-pay | Admitting: Cardiovascular Disease

## 2011-10-11 DIAGNOSIS — Q211 Atrial septal defect: Secondary | ICD-10-CM

## 2011-10-11 LAB — CBC WITH DIFFERENTIAL/PLATELET
Basophils Relative: 0.5 % (ref 0.0–3.0)
Eosinophils Absolute: 0 10*3/uL (ref 0.0–0.7)
Eosinophils Relative: 0.5 % (ref 0.0–5.0)
Lymphocytes Relative: 25.4 % (ref 12.0–46.0)
MCHC: 28.5 g/dL — ABNORMAL LOW (ref 30.0–36.0)
Neutrophils Relative %: 68.4 % (ref 43.0–77.0)
RBC: 4.2 Mil/uL (ref 3.87–5.11)
WBC: 7.3 10*3/uL (ref 4.5–10.5)

## 2011-10-11 LAB — BASIC METABOLIC PANEL
Calcium: 9 mg/dL (ref 8.4–10.5)
Creatinine, Ser: 0.8 mg/dL (ref 0.4–1.2)
GFR: 106.91 mL/min (ref >60.00)

## 2011-10-11 LAB — APTT: aPTT: 26.5 s (ref 21.7–28.8)

## 2011-10-11 LAB — PROTIME-INR: INR: 1 ratio (ref 0.8–1.0)

## 2011-10-11 NOTE — Telephone Encounter (Signed)
10/11/11--pt calling wanting update on FMLA paperwork--asked kim mesiemore to call pt with update and she states she will call pt--nt

## 2011-10-11 NOTE — Telephone Encounter (Signed)
Pt calling re FMLA paperwork

## 2011-10-12 ENCOUNTER — Telehealth: Payer: Self-pay | Admitting: Cardiovascular Disease

## 2011-10-12 DIAGNOSIS — D509 Iron deficiency anemia, unspecified: Secondary | ICD-10-CM

## 2011-10-12 NOTE — Telephone Encounter (Signed)
I called the patient to discuss her lab work. Her hemoglobin is 7.4. I asked her to start taking an iron supplement as she hasn't marked microcytic anemia. Will arrange a hematology consult as I suspect she may need intravenous iron. Her PFO closure procedure has been postponed because of her anemia. I would like to see her hemoglobin at least in the range of 9 mg/dL or better if we can achieve this before receiving with PFO closure.

## 2011-10-12 NOTE — Telephone Encounter (Signed)
Check out today's latest NEJM-  Two studies concerning PFO closure

## 2011-10-12 NOTE — Telephone Encounter (Signed)
Records faxed to [facility] for Hematology referral.

## 2011-10-14 ENCOUNTER — Encounter (HOSPITAL_COMMUNITY): Payer: Self-pay

## 2011-10-14 ENCOUNTER — Ambulatory Visit (HOSPITAL_COMMUNITY): Admit: 2011-10-14 | Payer: 59 | Admitting: Cardiovascular Disease

## 2011-10-14 SURGERY — PATENT FORAMEN OVALE CLOSURE
Anesthesia: LOCAL

## 2011-10-14 NOTE — Telephone Encounter (Signed)
Merita Norton is working on this appointment.  She said they are going to call our office next week with appointment information.

## 2011-10-17 ENCOUNTER — Telehealth: Payer: Self-pay | Admitting: Oncology

## 2011-10-17 NOTE — Telephone Encounter (Signed)
CALLED PT AND SCHEDULED APPT FOR 03/27.  WILL FAX OVER LETTER TO dR. cOPPER WITH APPT D/T

## 2011-10-18 ENCOUNTER — Encounter: Payer: Self-pay | Admitting: Oncology

## 2011-10-18 ENCOUNTER — Telehealth: Payer: Self-pay | Admitting: Oncology

## 2011-10-18 DIAGNOSIS — D509 Iron deficiency anemia, unspecified: Secondary | ICD-10-CM | POA: Insufficient documentation

## 2011-10-18 HISTORY — DX: Iron deficiency anemia, unspecified: D50.9

## 2011-10-18 NOTE — Telephone Encounter (Signed)
Referred by Dr. Excell Seltzer Dx- Anemia

## 2011-10-18 NOTE — Telephone Encounter (Signed)
I spoke with the pt and she is scheduled to see Hematology on 10/19/11 at 10.  I spoke with the pt and she is aware of this appointment.

## 2011-10-19 ENCOUNTER — Encounter: Payer: Self-pay | Admitting: Oncology

## 2011-10-19 ENCOUNTER — Other Ambulatory Visit: Payer: 59 | Admitting: Lab

## 2011-10-19 ENCOUNTER — Ambulatory Visit: Payer: 59

## 2011-10-19 ENCOUNTER — Telehealth: Payer: Self-pay | Admitting: Oncology

## 2011-10-19 ENCOUNTER — Ambulatory Visit (HOSPITAL_BASED_OUTPATIENT_CLINIC_OR_DEPARTMENT_OTHER): Payer: 59 | Admitting: Oncology

## 2011-10-19 VITALS — BP 119/74 | HR 83 | Temp 97.9°F | Ht 65.0 in | Wt 164.5 lb

## 2011-10-19 DIAGNOSIS — D509 Iron deficiency anemia, unspecified: Secondary | ICD-10-CM

## 2011-10-19 DIAGNOSIS — D500 Iron deficiency anemia secondary to blood loss (chronic): Secondary | ICD-10-CM

## 2011-10-19 DIAGNOSIS — N946 Dysmenorrhea, unspecified: Secondary | ICD-10-CM

## 2011-10-19 LAB — CBC WITH DIFFERENTIAL/PLATELET
EOS%: 1.5 % (ref 0.0–7.0)
MCH: 17.5 pg — ABNORMAL LOW (ref 25.1–34.0)
MCHC: 28.1 g/dL — ABNORMAL LOW (ref 31.5–36.0)
MCV: 62.2 fL — ABNORMAL LOW (ref 79.5–101.0)
MONO%: 9 % (ref 0.0–14.0)
RBC: 4.52 10*6/uL (ref 3.70–5.45)
RDW: 19.5 % — ABNORMAL HIGH (ref 11.2–14.5)
nRBC: 0 % (ref 0–0)

## 2011-10-19 LAB — IRON AND TIBC
TIBC: 454 ug/dL (ref 250–470)
UIBC: 443 ug/dL — ABNORMAL HIGH (ref 125–400)

## 2011-10-19 LAB — HEPATIC FUNCTION PANEL
AST: 14 U/L (ref 0–37)
Albumin: 4.3 g/dL (ref 3.5–5.2)
Total Bilirubin: 0.4 mg/dL (ref 0.3–1.2)
Total Protein: 7.5 g/dL (ref 6.0–8.3)

## 2011-10-19 LAB — FERRITIN: Ferritin: 2 ng/mL — ABNORMAL LOW (ref 10–291)

## 2011-10-19 LAB — MORPHOLOGY

## 2011-10-19 LAB — LACTATE DEHYDROGENASE: LDH: 101 U/L (ref 94–250)

## 2011-10-19 NOTE — Progress Notes (Signed)
[facility] INITIAL HEMATOLOGY CONSULTATION  Referral MD:  Dr. [name], M.D.  Reason for Referral: microcytic anemia.     HPI:  [name] is a 41 year-old [place] American woman with chronic history of microcytic anemia.  I reviewed the extensive medical record provided by PCP and referral MD for review.  She noticed that she has had intermittent anemia since she was 41 years-old.  She has been on intermittent oral iron supplement with improvement of her iron store and resolution of her anemia.  She has never had to have IV iron or pRBC transfusion.  She has not been taking iron for years except for what is in her MVI.  She has had CVA and TIA's the past few years.  She was found to have patent PFO's.  She never has documented DVT's or PE's.  She therefore was seen by Dr. Excell Seltzer who recommended closure of PFO.  However, she was found to have microcytic anemia.  Thus, she was kindly referred to the [facility] for evaluation.   [name] is here by herself today.  She has mild fatigue; however, she is still able to work full time and take care of two children.  She has mild SOB and DOE.  She denies chest pain, palpitation.  She has had dysmenorrhea for years.  Her menstrual cycle is irregular anywhere from 2-4 weeks interval.  Each time her cycle lasts for about 5 days.  The first 2 days are quite heavy where she changes pads every 2 hours.  In the past, when she was taking oral iron supplement, it took about 1-2 month to improve her anemia.  She did not have side effects with oral iron in the past.    Patient denies headache, visual changes, confusion, drenching night sweats, palpable lymph node swelling, mucositis, odynophagia, dysphagia, nausea vomiting, jaundice, chest pain, palpitation, shortness of breath, dyspnea on exertion, productive cough, gum bleeding, epistaxis, hematemesis, hemoptysis, abdominal pain, abdominal swelling, early satiety, melena, hematochezia,  hematuria, skin rash, spontaneous bleeding, joint swelling, joint pain, heat or cold intolerance, bowel bladder incontinence, back pain, focal motor weakness, paresthesia, depression, suicidal or homocidal ideation, feeling hopelessness.   Past Medical History  Diagnosis Date  . History of TIAs     09/2009 and 02/2011; no residual deficit  . 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 PFO (patent foramen ovale)   . Mitral regurgitation     mild  . Migraine headache   . Mild tricuspid regurgitation   . Asthma   . Chicken pox   . Allergy     hay fever  . Microcytic anemia 10/18/2011  . Dysmenorrhea   . Iron deficiency anemia due to chronic blood loss   :    History reviewed. No pertinent past surgical history.:   CURRENT MEDS: Current Outpatient Prescriptions  Medication Sig Dispense Refill  . clopidogrel (PLAVIX) 75 MG tablet Take 1 tablet (75 mg total) by mouth daily.  90 tablet  4  . Multiple Vitamin (MULTIVITAMIN) tablet Take 1 tablet by mouth daily.            Allergies  Allergen Reactions  . Aspirin   . Lisinopril   :  Family History  Problem Relation Age of Onset  . Hypertension      positive family hx of  . Alcohol abuse    . Arrhythmia Mother   . Arthritis Maternal Grandmother   .  Alcohol abuse Maternal Grandfather   . Alcohol abuse Paternal Grandmother   . Cancer Maternal Uncle     Prostate Cancer  . Cancer Maternal Uncle     Prostate Cancer  :  History   Social History  . Marital Status: Single    Spouse Name: N/A    Number of Children: 2  . Years of Education: N/A   Occupational History  .        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  [employer]   Social History Main Topics  . Smoking status: Never Smoker   . Smokeless tobacco: Never Used  . Alcohol Use: Yes  . Drug Use: No  . Sexually Active: No   Other Topics Concern  . Not on file   Social History Narrative  . No narrative on file   :  REVIEW OF SYSTEM:  The rest of the 14-point review of sytem was negative.   Exam:   General:  well-nourished in no acute distress.  Eyes:  no scleral icterus.  ENT:  There were no oropharyngeal lesions.  Neck was without thyromegaly.  Lymphatics:  Negative cervical, supraclavicular or axillary adenopathy.  Respiratory: lungs were clear bilaterally without wheezing or crackles.  Cardiovascular:  Regular rate and rhythm, S1/S2, without murmur, rub or gallop.  There was no pedal edema.  GI:  abdomen was soft, flat, nontender, nondistended, without organomegaly.  Rectal exam in the presence of nursing staff showed normal rectal tone and brown stool.  Hem occult Guiac negative.  Muscoloskeletal:  no spinal tenderness of palpation of vertebral spine.  Skin exam was without echymosis, petichae.  Neuro exam was nonfocal.  Patient was able to get on and off exam table without assistance.  Gait was normal.  Patient was alerted and oriented.  Attention was good.   Language was appropriate.  Mood was normal without depression.  Speech was not pressured.  Thought content was not tangential.    LABS:  Lab Results  Component Value Date   WBC 4.0 10/19/2011   HGB 7.9* 10/19/2011   HCT 28.1* 10/19/2011   PLT 417* 10/19/2011   GLUCOSE 90 10/11/2011   CHOL 167 03/23/2011   TRIG 103 03/23/2011   HDL 57 03/23/2011   LDLCALC 89 03/23/2011   ALT 6 03/22/2011   AST 14 03/22/2011   NA 138 10/11/2011   K 4.2 10/11/2011   CL 109 10/11/2011   CREATININE 0.8 10/11/2011   BUN 9 10/11/2011   CO2 22 10/11/2011   INR 1.0 10/11/2011   HGBA1C 6.0* 03/22/2011    Blood smear review:   I personally reviewed the patient's peripheral blood smear today.  There was anisocytosis.  There were elliptical RBC's and rare target cells.  There was no peripheral blast.  There was no schistocytosis, spherocytosis, rouleaux formation, tear drop cell.  There was no giant platelets or platelet clumps.     ASSESSMENT AND PLAN:   1.  CVA,  TIA's:  She is on Plavix per PCP.  She had reaction to aspirin and beta blocker. 2.  Patent PFO:  Pending evaluation by Dr. Excell Seltzer for closure. 3.  Dysmenorrhea:  She is afraid to take oral contraceptives given his of CVA/TIA's in the past.  She follows with Dr. Henreitta Leber.  I wonder if patient is a candidate for endometrial ablation given her severe chronic iron deficiency anemia from menstrual blood loss. I defer to Dr. Roanna Banning expertise.  4.  Microcytic anemia:  Ddx:  Most likely iron deficiency anemia.  I have low concern for hemoglobinopathy since her Hgb in 2011 was normal.  There is low clinical concern for hemolysis per lab.  I also have low concern for primary bone marrow failure state.    Work up:  Pending iron panel for confirmation.    Treatment:  - I advised pt that even though she did have response to oral iron in the past, it took her a month or two to improve her Hgb.  I recommended IV iron in the form of Ferahem.  It can potentially replete her iron store much faster and improve her Hgb in time for her PFO closure.  The longer she has patent PFO, the more at risk she is for another CVA event.  Therefore, I recommended to correct her anemia as fast as possible.  She does not accept blood transfusion being Jehova's witness.  If despite IV Ferahem infusion and her Hgb does not improve, then further work up may be considered.   - I discussed with her that Kathrine Cords has lower chance of reaction compared to older formulations.  However, there is still a low chance of reaction.  She expressed informed understand and wished to proceed with [facility] later this week.   - For follow up, we will check her CBC here at the Cancer [facility] in 1/2, 1 and 2 months.  She has follow up with Clenton Pare, NP in 4 months and with me in 8 months.      The length of time of the face-to-face encounter was 45 minutes. More than 50% of time was spent counseling and coordination of care.

## 2011-10-19 NOTE — Patient Instructions (Signed)
Anemia:  From most likely iron-deficiency from heavy menstrual blood loss. - I sent for iron panel today to confirm iron deficiency. - IV Feraheme appointment within 3 days. - Start oral iron (Slow Fe, NuIron, or any generic iron over the counter) along with Vitamin C once daily. - Follow up with Lab only appointment in 1, 2, months.  Visit with Belenda Cruise, NP in 4 months, and with me in 8 months. - If your Hgb does not improve with IV Iron, we will consider further work up.

## 2011-10-19 NOTE — Progress Notes (Signed)
Patient did feel as though she made need some financial assistance, I did give patient EPP application to fill out. And advised patient that is she had any questions please feel free to give me a call.

## 2011-10-19 NOTE — Telephone Encounter (Signed)
Gv pt appt for [id number]

## 2011-10-21 ENCOUNTER — Ambulatory Visit (HOSPITAL_BASED_OUTPATIENT_CLINIC_OR_DEPARTMENT_OTHER): Payer: 59

## 2011-10-21 VITALS — BP 153/83 | HR 77 | Temp 97.4°F

## 2011-10-21 DIAGNOSIS — D509 Iron deficiency anemia, unspecified: Secondary | ICD-10-CM

## 2011-10-21 MED ORDER — SODIUM CHLORIDE 0.9 % IV SOLN
1020.0000 mg | Freq: Once | INTRAVENOUS | Status: AC
  Administered 2011-10-21: 1020 mg via INTRAVENOUS
  Filled 2011-10-21: qty 34

## 2011-10-21 MED ORDER — METHYLPREDNISOLONE SODIUM SUCC 125 MG IJ SOLR
125.0000 mg | Freq: Once | INTRAMUSCULAR | Status: AC
  Administered 2011-10-21: 125 mg via INTRAVENOUS

## 2011-10-21 MED ORDER — ALBUTEROL SULFATE (2.5 MG/3ML) 0.083% IN NEBU
2.5000 mg | INHALATION_SOLUTION | Freq: Once | RESPIRATORY_TRACT | Status: AC
  Administered 2011-10-21: 2.5 mg via RESPIRATORY_TRACT
  Filled 2011-10-21: qty 3

## 2011-10-21 MED ORDER — DIPHENHYDRAMINE HCL 50 MG/ML IJ SOLN
50.0000 mg | Freq: Once | INTRAMUSCULAR | Status: AC
  Administered 2011-10-21: 50 mg via INTRAVENOUS

## 2011-10-21 NOTE — Progress Notes (Signed)
At 1008, patient complained of shortness of breath, throat closing.  RN noticed facial flushing and watery eyes.  Feraheme immediately stopped and NS run wide open.  VSS.  Condition worsened.  50mg  IV Benadryl given and 125 Solu-medrol given.  Clenton Pare, NP came to assess patient.  2.5mg  Albuterol also initiated d/t noticeable wheezing.  All meds documented through [facility].  Oxygenation remained at 100% throughout.  Normal saline continued, and will observe patient for at least 30 minutes.  Instructed to notify Clenton Pare, NP upon discharge to reassess patient before discharge.  Belenda Cruise came to assess at 1110- okay to discharge home.  VSS.  Patient drowsy, but asymptomatic.  Called friend to drive home.

## 2011-10-25 ENCOUNTER — Telehealth: Payer: Self-pay | Admitting: Oncology

## 2011-10-25 NOTE — Telephone Encounter (Signed)
Telephone call to patient. I let her know that I spoke with Dr Gaylyn Rong regarding the infusion reaction that she had. He recommends taking Nu-iron or SlowFe with Vitamin C. The patient states that she has already otained the Nu-iron. Reviewed that she needs to take this twice a day with Vitamin C. She states understanding and will keep follow-up appointments as scheduled.

## 2011-11-03 ENCOUNTER — Other Ambulatory Visit: Payer: 59 | Admitting: Lab

## 2011-11-17 ENCOUNTER — Other Ambulatory Visit (HOSPITAL_BASED_OUTPATIENT_CLINIC_OR_DEPARTMENT_OTHER): Payer: 59 | Admitting: Lab

## 2011-11-17 ENCOUNTER — Telehealth: Payer: Self-pay | Admitting: *Deleted

## 2011-11-17 DIAGNOSIS — D509 Iron deficiency anemia, unspecified: Secondary | ICD-10-CM

## 2011-11-17 LAB — CBC WITH DIFFERENTIAL/PLATELET
BASO%: 1.3 % (ref 0.0–2.0)
LYMPH%: 40.4 % (ref 14.0–49.7)
MCHC: 29.6 g/dL — ABNORMAL LOW (ref 31.5–36.0)
MONO#: 0.3 10*3/uL (ref 0.1–0.9)
RBC: 4.59 10*6/uL (ref 3.70–5.45)
WBC: 3.2 10*3/uL — ABNORMAL LOW (ref 3.9–10.3)
lymph#: 1.3 10*3/uL (ref 0.9–3.3)
nRBC: 0 % (ref 0–0)

## 2011-11-17 NOTE — Telephone Encounter (Signed)
Called pt and informed her of Hgb improved at 9.0 today and to call Dr. Excell Seltzer to find out what Hgb he feels comfortable doing surgery. Instructed her to continue taking oral iron twice daily with vitamin C once daily.  She verbalized understanding.

## 2011-11-17 NOTE — Telephone Encounter (Signed)
Message copied by Wende Mott on Thu Nov 17, 2011  4:09 PM ------      Message from: HA, Raliegh Ip T      Created: Thu Nov 17, 2011 10:35 AM       Please call pt.  Please inform her that her Hgb improved today.  Please advise her to continue Oral iron BID along with VitC.  She should contact Dr. [name] to find out at what Hgb would he feel comfortable scheduling her for surgery for her patent PFO.              Thanks.

## 2011-11-29 ENCOUNTER — Ambulatory Visit: Payer: 59 | Admitting: Cardiovascular Disease

## 2011-12-22 ENCOUNTER — Other Ambulatory Visit: Payer: 59 | Admitting: Lab

## 2012-01-04 ENCOUNTER — Ambulatory Visit (INDEPENDENT_AMBULATORY_CARE_PROVIDER_SITE_OTHER): Payer: 59 | Admitting: Cardiovascular Disease

## 2012-01-04 ENCOUNTER — Encounter: Payer: Self-pay | Admitting: Cardiovascular Disease

## 2012-01-04 VITALS — BP 110/72 | HR 84 | Ht 65.0 in | Wt 170.0 lb

## 2012-01-04 DIAGNOSIS — Q211 Atrial septal defect: Secondary | ICD-10-CM

## 2012-01-04 NOTE — Patient Instructions (Addendum)
Your physician recommends that you schedule a follow-up appointment in: ONE MONTH AFTER PFO CLOSURE  Your physician recommends that you HAVE LAB WORK TODAY

## 2012-01-05 LAB — BASIC METABOLIC PANEL
BUN: 10 mg/dL (ref 6–23)
CO2: 24 mEq/L (ref 19–32)
Calcium: 9.4 mg/dL (ref 8.4–10.5)
Glucose, Bld: 100 mg/dL — ABNORMAL HIGH (ref 70–99)
Sodium: 138 mEq/L (ref 135–145)

## 2012-01-05 LAB — CBC WITH DIFFERENTIAL/PLATELET
HCT: 30.8 % — ABNORMAL LOW (ref 36.0–46.0)
Hemoglobin: 9 g/dL — ABNORMAL LOW (ref 12.0–15.0)
MCHC: 29.2 g/dL — ABNORMAL LOW (ref 30.0–36.0)
Platelets: 274 10*3/uL (ref 150.0–400.0)
RDW: 22.4 % — ABNORMAL HIGH (ref 11.5–14.6)

## 2012-01-05 LAB — PROTIME-INR: INR: 1.1 ratio — ABNORMAL HIGH (ref 0.8–1.0)

## 2012-01-19 ENCOUNTER — Encounter (HOSPITAL_COMMUNITY): Payer: Self-pay | Admitting: Pharmacy Technician

## 2012-01-20 ENCOUNTER — Encounter: Payer: Self-pay | Admitting: Cardiovascular Disease

## 2012-01-20 NOTE — Progress Notes (Signed)
HPI:  This is a 41 year old woman presenting for followup evaluation. She has a history of PFO and recurrent stroke. She was initially seen in May 2012 and at that time she had 2 prior episodes of expressive aphasia and left-sided weakness. She was first diagnosed with a TIA in 2010 and she took Plavix for approximately 6 months prior to discontinuing it because of adverse effects. She has a true aspirin allergy. A CT scan of the brain showed a cerebellar infarct after his second TIA in 2011. She ultimately underwent a transesophageal echocardiogram demonstrating a tunneled patent foramen ovale with right-to-left shunt as well as a prominent eustachian valve.  The patient was last seen 09/13/2011 and she was scheduled for transcatheter PFO closure. However, her laboratory data demonstrated marked microcytic anemia and she has had to be treated with intravenous iron infusions prior to undergoing PFO closure in order to safely do the procedure. 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  Her last hospitalization was in August 2012 when she presented with dizziness and right ear tinnitus as well as right scalp and facial numbness and some weakness in her right arm and leg. She had some difficulty in comprehension. An MRI of the brain showed no acute infarction but did show an old right cerebellar infarct. The patient has been on Plavix now for several months without bleeding problems. Her most recent hemoglobin June 12 was 9.0. This is improved from her previous hemoglobin of 7.4.  Outpatient Encounter Prescriptions as of 01/04/2012  Medication Sig Dispense Refill  . clopidogrel (PLAVIX) 75 MG tablet Take 1 tablet (75 mg total) by mouth daily.  90 tablet  4  . ferrous sulfate 325 (65 FE) MG tablet Take 325 mg by mouth daily with breakfast.      . DISCONTD: Multiple Vitamin (MULTIVITAMIN) tablet Take 1  tablet by mouth daily.          Allergies  Allergen Reactions  . Aspirin Anaphylaxis  . Lisinopril Hives and Swelling    Past Medical History  Diagnosis Date  . History of TIAs     09/2009 and 02/2011; no residual deficit  . 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 PFO (patent foramen ovale)   . Mitral regurgitation     mild  . Migraine headache   . Mild tricuspid regurgitation   . Asthma   . Chicken pox   . Allergy     hay fever  . Microcytic anemia 10/18/2011  . Dysmenorrhea   . Iron deficiency anemia due to chronic blood loss     ROS: Negative except as per HPI  BP 110/72  Pulse 84  Ht 5\' 5"  (1.651 m)  Wt 77.111 kg (170 lb)  BMI 28.29 kg/m2  PHYSICAL EXAM: Pt is alert and oriented, NAD HEENT: normal Neck: JVP - normal, carotids 2+= without bruits Lungs: CTA bilaterally CV: RRR without murmur or gallop Abd: soft, NT, Positive BS, no hepatomegaly Ext: no C/C/E, distal pulses intact and equal Skin: warm/dry no rash  ASSESSMENT AND PLAN: PFO with history of recurrent TIAs. Neuro imaging has documented a cerebellar stroke. Considering her young age, the fact that she has chronic anemia and is a TEFL teacher Witness, and that she has had recurrent neurologic events, I feel transcatheter PFO closure is indicated. Her hemoglobin is now improved after receiving intravenous iron.  I have again reviewed the risks, indications, and alternatives to transcatheter PFO closure. This is been well documented in previous notes. The patient understands and would like to proceed with transcatheter closure of her PFO as soon as possible.  [name]

## 2012-01-24 ENCOUNTER — Emergency Department (HOSPITAL_COMMUNITY): Payer: 59

## 2012-01-24 ENCOUNTER — Emergency Department (HOSPITAL_COMMUNITY)
Admission: EM | Admit: 2012-01-24 | Discharge: 2012-01-24 | Disposition: A | Discharge status: Home / Self Care | Payer: 59 | Attending: Emergency Medicine | Admitting: Emergency Medicine

## 2012-01-24 ENCOUNTER — Encounter (HOSPITAL_COMMUNITY): Payer: Self-pay | Admitting: *Deleted

## 2012-01-24 DIAGNOSIS — R42 Dizziness and giddiness: Secondary | ICD-10-CM

## 2012-01-24 DIAGNOSIS — Z79899 Other long term (current) drug therapy: Secondary | ICD-10-CM | POA: Insufficient documentation

## 2012-01-24 DIAGNOSIS — Q211 Atrial septal defect: Secondary | ICD-10-CM | POA: Insufficient documentation

## 2012-01-24 DIAGNOSIS — Q2111 Secundum atrial septal defect: Secondary | ICD-10-CM | POA: Insufficient documentation

## 2012-01-24 DIAGNOSIS — J45909 Unspecified asthma, uncomplicated: Secondary | ICD-10-CM | POA: Insufficient documentation

## 2012-01-24 DIAGNOSIS — Z8673 Personal history of transient ischemic attack (TIA), and cerebral infarction without residual deficits: Secondary | ICD-10-CM | POA: Insufficient documentation

## 2012-01-24 LAB — POCT I-STAT, CHEM 8
Calcium, Ion: 1.22 mmol/L (ref 1.12–1.32)
Creatinine, Ser: 0.8 mg/dL (ref 0.50–1.10)
HCT: 33 % — ABNORMAL LOW (ref 36.0–46.0)
Sodium: 140 mEq/L (ref 135–145)

## 2012-01-24 LAB — DIFFERENTIAL
Basophils Relative: 1 % (ref 0–1)
Lymphocytes Relative: 25 % (ref 12–46)
Monocytes Relative: 6 % (ref 3–12)
Neutro Abs: 4 10*3/uL (ref 1.7–7.7)

## 2012-01-24 LAB — CBC
HCT: 30.4 % — ABNORMAL LOW (ref 36.0–46.0)
Hemoglobin: 8.7 g/dL — ABNORMAL LOW (ref 12.0–15.0)
WBC: 6.2 10*3/uL (ref 4.0–10.5)

## 2012-01-24 LAB — COMPREHENSIVE METABOLIC PANEL
Alkaline Phosphatase: 101 U/L (ref 39–117)
BUN: 5 mg/dL — ABNORMAL LOW (ref 6–23)
Chloride: 104 mEq/L (ref 96–112)
GFR calc Af Amer: >90 mL/min (ref >90)
Glucose, Bld: 107 mg/dL — ABNORMAL HIGH (ref 70–99)
Potassium: 3.5 mEq/L (ref 3.5–5.1)
Total Bilirubin: 0.3 mg/dL (ref 0.3–1.2)

## 2012-01-24 LAB — RAPID URINE DRUG SCREEN, HOSP PERFORMED
Amphetamines: NOT DETECTED
Opiates: NOT DETECTED
Tetrahydrocannabinol: NOT DETECTED

## 2012-01-24 LAB — GLUCOSE, CAPILLARY: Glucose-Capillary: 105 mg/dL — ABNORMAL HIGH (ref 70–99)

## 2012-01-24 LAB — APTT: aPTT: 31 seconds (ref 24–37)

## 2012-01-24 LAB — CK TOTAL AND CKMB (NOT AT ARMC): Total CK: 193 U/L — ABNORMAL HIGH (ref 7–177)

## 2012-01-24 NOTE — ED Provider Notes (Addendum)
History     CSN: [id number]  Arrival date & time 01/24/12  4098   First MD Initiated Contact with Patient 01/24/12 1020      Chief Complaint  Patient presents with  . Dizziness    (Consider location/radiation/quality/duration/timing/severity/associated sxs/prior treatment) HPI Pt reports she was in her normal state of health, sitting at her desk at work, when she began to have a feeling of dizziness like she was off balance. Denies any headache, blurry vision, arm numbness or weakness. She states symptoms are worse with standing and improved with lying down. She has known patent foramen ovale and has had TIA/Stroke in the past. She is currently taking Plavix (allergic to ASA). Is scheduled for PFO repair in about 10 days.   Past Medical History  Diagnosis Date  . History of TIAs     09/2009 and 02/2011; no residual deficit  . 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 PFO (patent foramen ovale)   . Mitral regurgitation     mild  . Migraine headache   . Mild tricuspid regurgitation   . Asthma   . Chicken pox   . Allergy     hay fever  . Microcytic anemia 10/18/2011  . Dysmenorrhea   . Iron deficiency anemia due to chronic blood loss     History reviewed. No pertinent past surgical history.  Family History  Problem Relation Age of Onset  . Hypertension      positive family hx of  . Alcohol abuse    . Arrhythmia Mother   . Arthritis Maternal Grandmother   . Alcohol abuse Maternal Grandfather   . Alcohol abuse Paternal Grandmother   . Cancer Maternal Uncle     Prostate Cancer  . Cancer Maternal Uncle     Prostate Cancer    History  Substance Use Topics  . Smoking status: Never Smoker   . Smokeless tobacco: Never Used  . Alcohol Use: Yes    OB History    Grav Para Term Preterm Abortions TAB SAB Ect Mult Living                  Review of Systems All other systems reviewed and are negative except as noted in HPI.    Allergies  Aspirin and Lisinopril  Home Medications   Current Outpatient Rx  Name Route Sig Dispense Refill  . CLOPIDOGREL BISULFATE 75 MG PO TABS Oral Take 75 mg by mouth every evening.    Marland Kitchen FERROUS SULFATE 325 (65 FE) MG PO TABS Oral Take 325 mg by mouth every evening.       BP 138/94  Pulse 67  Temp 98.4 F (36.9 C) (Oral)  Resp 16  Ht 5\' 5"  (1.651 m)  Wt 168 lb (76.204 kg)  BMI 27.96 kg/m2  SpO2 100%  Physical Exam  Nursing note and vitals reviewed. Constitutional: She is oriented to person, place, and time. She appears well-developed and well-nourished.  HENT:  Head: Normocephalic and atraumatic.  Eyes: EOM are normal. Pupils are equal, round, and reactive to light.  Neck: Normal range of motion. Neck supple.  Cardiovascular: Normal rate, normal heart sounds and intact distal pulses.   Pulmonary/Chest: Effort normal and breath sounds normal.  Abdominal: Bowel sounds are normal. She exhibits no distension. There is no tenderness.  Musculoskeletal: Normal range of motion. She exhibits no edema and no tenderness.  Neurological: She is alert and oriented to person, place, and  time. She has normal strength and normal reflexes. No cranial nerve deficit or sensory deficit. Coordination normal.       No nystagmus with sitting up  Skin: Skin is warm and dry. No rash noted.  Psychiatric: She has a normal mood and affect.    ED Course  Procedures (including critical care time)  Labs Reviewed  GLUCOSE, CAPILLARY - Abnormal; Notable for the following:    Glucose-Capillary 105 (*)     All other components within normal limits  POCT I-STAT, CHEM 8 - Abnormal; Notable for the following:    BUN 3 (*)     Glucose, Bld 106 (*)     Hemoglobin 11.2 (*)     HCT 33.0 (*)     All other components within normal limits  PROTIME-INR  APTT  CBC  DIFFERENTIAL  COMPREHENSIVE METABOLIC PANEL  CK TOTAL AND CKMB  TROPONIN I  URINE RAPID DRUG SCREEN (HOSP PERFORMED)   No results  found.   No diagnosis found.    MDM  Pt with vertiginous symptoms, history of PFO and cerebellar stroke on prior MRI. She is asymptomatic now, not a candidate for tPA. Possibly has had another TIA but already on Plavix and PFO scheduled for repair. Will check labs, MRI/MRA, observe in CDU for workup. Case discussed with Trixie Dredge, PA.     Date: 01/24/2012  Rate: 63  Rhythm: normal sinus rhythm  QRS Axis: normal  Intervals: normal  ST/T Wave abnormalities: normal  Conduction Disutrbances:none  Narrative Interpretation:   Old EKG Reviewed: unchanged       Pardeep Pautz B. Bernette Mayers, MD 01/24/12 1106  Seana Underwood B. Bernette Mayers, MD 01/24/12 1110

## 2012-01-24 NOTE — ED Notes (Signed)
SPOKE WITH MRI. THEY ADVISE THEY ARE SENDING FOR PT NOW. THEY WILL DROP PT OFF IN CDU WHEN SCANS COMPLETED

## 2012-01-24 NOTE — ED Notes (Signed)
PT CONTINUES IN MRI 

## 2012-01-24 NOTE — ED Notes (Signed)
Patient states she is very dizzy,  "fall over dizzy"    Patient states she had sudden onset of sx at 0900.  She was normal this morning.  Patient denies pain.  Patient denies recent cold.  Patient has hx of tia.  Patient states her sx were tingling and numbness and aphasia.  Patient with no sx except dizziness today

## 2012-01-24 NOTE — ED Notes (Signed)
Denies any headache or blurry vision with the dizziness.

## 2012-01-24 NOTE — ED Notes (Signed)
Pt. Had to void was unable at this time.

## 2012-01-24 NOTE — ED Provider Notes (Signed)
Patient to come over to CDU for holding, MRI pending.  Sign out received from Dr Bernette Mayers.  Patient with hx stroke, TIA, with known PFO - plan for surgery next week.  Pt currently asymptomatic.  Care assumed by Pixie Casino, PA-C, upon arrival to CDU.    Results for orders placed during the hospital encounter of 01/24/12  PROTIME-INR      Component Value Range   Prothrombin Time 14.8  11.6 - 15.2 seconds   INR 1.14  0.00 - 1.49  APTT      Component Value Range   aPTT 31  24 - 37 seconds  CBC      Component Value Range   WBC 6.2  4.0 - 10.5 K/uL   RBC 4.69  3.87 - 5.11 MIL/uL   Hemoglobin 8.7 (*) 12.0 - 15.0 g/dL   HCT 78.2 (*) 95.6 - 21.3 %   MCV 64.8 (*) 78.0 - 100.0 fL   MCH 18.6 (*) 26.0 - 34.0 pg   MCHC 28.6 (*) 30.0 - 36.0 g/dL   RDW 08.6 (*) 57.8 - 46.9 %   Platelets 399  150 - 400 K/uL  DIFFERENTIAL      Component Value Range   Neutrophils Relative 67  43 - 77 %   Lymphocytes Relative 25  12 - 46 %   Monocytes Relative 6  3 - 12 %   Eosinophils Relative 1  0 - 5 %   Basophils Relative 1  0 - 1 %   Neutro Abs 4.0  1.7 - 7.7 K/uL   Lymphs Abs 1.6  0.7 - 4.0 K/uL   Monocytes Absolute 0.4  0.1 - 1.0 K/uL   Eosinophils Absolute 0.1  0.0 - 0.7 K/uL   Basophils Absolute 0.1  0.0 - 0.1 K/uL   RBC Morphology POLYCHROMASIA PRESENT    COMPREHENSIVE METABOLIC PANEL      Component Value Range   Sodium 135  135 - 145 mEq/L   Potassium 3.5  3.5 - 5.1 mEq/L   Chloride 104  96 - 112 mEq/L   CO2 20  19 - 32 mEq/L   Glucose, Bld 107 (*) 70 - 99 mg/dL   BUN 5 (*) 6 - 23 mg/dL   Creatinine, Ser 6.29  0.50 - 1.10 mg/dL   Calcium 9.3  8.4 - 52.8 mg/dL   Total Protein 7.7  6.0 - 8.3 g/dL   Albumin 3.8  3.5 - 5.2 g/dL   AST 15  0 - 37 U/L   ALT 8  0 - 35 U/L   Alkaline Phosphatase 101  39 - 117 U/L   Total Bilirubin 0.3  0.3 - 1.2 mg/dL   GFR calc non Af Amer 87 (*) >90 mL/min   GFR calc Af Amer >90  >90 mL/min  CK TOTAL AND CKMB      Component Value Range   Total CK 193 (*) 7 - 177  U/L   CK, MB 1.3  0.3 - 4.0 ng/mL   Relative Index 0.7  0.0 - 2.5  TROPONIN I      Component Value Range   Troponin I <0.30  <0.30 ng/mL  GLUCOSE, CAPILLARY      Component Value Range   Glucose-Capillary 105 (*) 70 - 99 mg/dL  POCT I-STAT, CHEM 8      Component Value Range   Sodium 140  135 - 145 mEq/L   Potassium 3.6  3.5 - 5.1 mEq/L   Chloride 108  96 -  112 mEq/L   BUN 3 (*) 6 - 23 mg/dL   Creatinine, Ser 1.61  0.50 - 1.10 mg/dL   Glucose, Bld 096 (*) 70 - 99 mg/dL   Calcium, Ion 0.45  4.09 - 1.32 mmol/L   TCO2 20  0 - 100 mmol/L   Hemoglobin 11.2 (*) 12.0 - 15.0 g/dL   HCT 81.1 (*) 91.4 - 78.2 %   No results found.    Rise Patience, [place] 01/24/12 1310

## 2012-01-24 NOTE — ED Notes (Signed)
Checked blood sugar it was 105 notified RN Lillia Abed of cbg

## 2012-01-24 NOTE — ED Notes (Signed)
Pt. Ambulate to restroom well.with no assit.

## 2012-01-24 NOTE — ED Provider Notes (Signed)
2:14 PM  Patient is in CDU holding awaiting results of MRI. This is a shared visit with Dr. Bernette Mayers and Trixie Dredge, PA-C. Cranial nerves II-XII intact. EOM intact, PERRL, normal strength and tone, no pronator drift, normal romberg, normal gait, heel shin intact, rapid alternating movements intact. Finger to nose movements normal. No signs of stroke or Morrie Daywalt. 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 MRI results were discussed with Dr. Bernette Mayers. Patient is able to ambulate without associated dizziness and can be discharged with appropriate follow-up for her PFO.Talk to Dr. Bernette Mayers about urine drug screen as part of the stroke protocol, patient does not need to wait for results before discharge.  MRI report   MR Angiogram Head Wo Contrast (Final result)   Result time:01/24/12 1346    Final result by Rad Results In Interface (01/24/12 13:46:12)    Narrative:   *RADIOLOGY REPORT*  Clinical Data: Sudden onset of dizziness. The patient has a known patent foramen ovale.  MRI HEAD WITHOUT CONTRAST MRA HEAD WITHOUT CONTRAST  Technique: Multiplanar, multiecho pulse sequences of the brain and surrounding structures were obtained without intravenous contrast. Angiographic images of the head were obtained using MRA technique without contrast.  Comparison: MRA brain and MRA head 03/22/2011.  MRI HEAD  Findings: The diffusion weighted images demonstrate no evidence for acute or subacute infarction. A remote lacunar infarct of the right cerebellum is stable. No other significant infarcts are present. There is no significant white matter disease. Flow is present in the major intracranial arteries the globes and orbits are intact. The paranasal sinuses and mastoid air cells are clear.  IMPRESSION:  1. No acute intracranial abnormality or significant interval change. 2. Stable remote lacunar infarct of the right cerebellum.  MRA HEAD  Findings:  The internal carotid arteries are within normal limits bilaterally from the high cervical segments through is the ICA termini. The left A1 segment is dominant to the right. The M1 segments are normal bilaterally. The MCA bifurcations are within normal limits. The ACA and MCA branch vessels are normal.  The left PICA origin is visualized and within normal limits. The right PICA is not visualized. The basilar artery is normal. A prominent right posterior communicating artery is evident. A small right P1 segment is evident. The left posterior cerebral artery originates from the basilar tip. The PCA branch vessels are within normal limits.  IMPRESSION: Normal variant MRA circle of Willis. There is no significant proximal stenosis, aneurysm, or branch vessel occlusion.  Original Report Authenticated By: Jamesetta Orleans. MATTERN, M.D.            MR Brain Wo Contrast (Final result)   Result time:01/24/12 1346    Final result by Rad Results In Interface (01/24/12 13:46:12)    Narrative:   *RADIOLOGY REPORT*  Clinical Data: Sudden onset of dizziness. The patient has a known patent foramen ovale.  MRI HEAD WITHOUT CONTRAST MRA HEAD WITHOUT CONTRAST  Technique: Multiplanar, multiecho pulse sequences of the brain and surrounding structures were obtained without intravenous contrast. Angiographic images of the head were obtained using MRA technique without contrast.  Comparison: MRA brain and MRA head 03/22/2011.  MRI HEAD  Findings: The diffusion weighted images demonstrate no evidence for acute or subacute infarction. A remote lacunar infarct of the right cerebellum is stable. No other significant infarcts are present. There is no significant white matter disease. Flow is present in the major intracranial arteries the globes and orbits are intact.  The paranasal sinuses and mastoid air cells are clear.  IMPRESSION:  1. No acute intracranial abnormality or significant  interval change. 2. Stable remote lacunar infarct of the right cerebellum.  MRA HEAD  Findings: The internal carotid arteries are within normal limits bilaterally from the high cervical segments through is the ICA termini. The left A1 segment is dominant to the right. The M1 segments are normal bilaterally. The MCA bifurcations are within normal limits. The ACA and MCA branch vessels are normal.  The left PICA origin is visualized and within normal limits. The right PICA is not visualized. The basilar artery is normal. A prominent right posterior communicating artery is evident. A small right P1 segment is evident. The left posterior cerebral artery originates from the basilar tip. The PCA branch vessels are within normal limits.  IMPRESSION: Normal variant MRA circle of Willis. There is no significant proximal stenosis, aneurysm, or branch vessel occlusion.  Original Report Authenticated By: Jamesetta Orleans. MATTERN, M.D.     Pixie Casino, PA-C 01/24/12 1606

## 2012-01-31 ENCOUNTER — Telehealth: Payer: Self-pay | Admitting: Cardiovascular Disease

## 2012-01-31 NOTE — Telephone Encounter (Signed)
New Problem:    Patient called in because she is going to have a procedure on Thursday and wanted to make sure that you had gotten her paperwork back to her employer.  Please call back.

## 2012-02-01 ENCOUNTER — Other Ambulatory Visit: Payer: Self-pay | Admitting: Cardiovascular Disease

## 2012-02-01 NOTE — Telephone Encounter (Signed)
I spoke with the Morgan Shaffer and made her aware that Dr Excell Seltzer completed paperwork today and this is being faxed by our medical records department.

## 2012-02-02 ENCOUNTER — Ambulatory Visit (HOSPITAL_COMMUNITY)
Admission: RE | Admit: 2012-02-02 | Discharge: 2012-02-03 | Disposition: A | Discharge status: Home / Self Care | Payer: 59 | Source: Ambulatory Visit | Attending: Cardiovascular Disease | Admitting: Cardiovascular Disease

## 2012-02-02 ENCOUNTER — Encounter (HOSPITAL_COMMUNITY)
Admission: RE | Disposition: A | Discharge status: Home / Self Care | Payer: Self-pay | Source: Ambulatory Visit | Attending: Cardiovascular Disease

## 2012-02-02 ENCOUNTER — Telehealth: Payer: Self-pay | Admitting: Cardiovascular Disease

## 2012-02-02 ENCOUNTER — Encounter (HOSPITAL_COMMUNITY): Payer: Self-pay | Admitting: General Practice

## 2012-02-02 DIAGNOSIS — I059 Rheumatic mitral valve disease, unspecified: Secondary | ICD-10-CM | POA: Insufficient documentation

## 2012-02-02 DIAGNOSIS — Q2111 Secundum atrial septal defect: Secondary | ICD-10-CM | POA: Insufficient documentation

## 2012-02-02 DIAGNOSIS — Q211 Atrial septal defect: Secondary | ICD-10-CM | POA: Insufficient documentation

## 2012-02-02 DIAGNOSIS — Z8673 Personal history of transient ischemic attack (TIA), and cerebral infarction without residual deficits: Secondary | ICD-10-CM | POA: Insufficient documentation

## 2012-02-02 DIAGNOSIS — I079 Rheumatic tricuspid valve disease, unspecified: Secondary | ICD-10-CM | POA: Insufficient documentation

## 2012-02-02 DIAGNOSIS — D509 Iron deficiency anemia, unspecified: Secondary | ICD-10-CM | POA: Insufficient documentation

## 2012-02-02 DIAGNOSIS — I6789 Other cerebrovascular disease: Secondary | ICD-10-CM

## 2012-02-02 HISTORY — DX: Shortness of breath: R06.02

## 2012-02-02 HISTORY — PX: PATENT FORAMEN OVALE CLOSURE: SHX5483

## 2012-02-02 HISTORY — DX: Procedure and treatment not carried out because of patient's decision for reasons of belief and group pressure: Z53.1

## 2012-02-02 HISTORY — PX: PATENT FORAMEN OVALE CLOSURE: SHX2181

## 2012-02-02 HISTORY — DX: Reserved for inherently not codable concepts without codable children: IMO0001

## 2012-02-02 LAB — POCT ACTIVATED CLOTTING TIME
Activated Clotting Time: 194 seconds
Activated Clotting Time: 199 seconds
Activated Clotting Time: 214 seconds
Activated Clotting Time: 184 seconds
Activated Clotting Time: 164 seconds

## 2012-02-02 LAB — HCG, SERUM, QUALITATIVE: Preg, Serum: NEGATIVE

## 2012-02-02 SURGERY — PATENT FORAMEN OVALE CLOSURE
Anesthesia: LOCAL

## 2012-02-02 MED ORDER — ONDANSETRON HCL 4 MG/2ML IJ SOLN: 4.0000 mg | Freq: Four times a day (QID) | INTRAMUSCULAR | Status: DC | PRN

## 2012-02-02 MED ORDER — SODIUM CHLORIDE 0.9 % IJ SOLN: 3.0000 mL | Freq: Two times a day (BID) | INTRAMUSCULAR | Status: DC

## 2012-02-02 MED ORDER — HEPARIN (PORCINE) IN NACL 2-0.9 UNIT/ML-% IJ SOLN
INTRAMUSCULAR | Status: AC
  Filled 2012-02-02: qty 1000

## 2012-02-02 MED ORDER — OXYCODONE-ACETAMINOPHEN 5-325 MG PO TABS
1.0000 | ORAL_TABLET | ORAL | Status: DC | PRN
  Administered 2012-02-02 – 2012-02-03 (×2): 1 via ORAL
  Filled 2012-02-02 (×2): qty 1

## 2012-02-02 MED ORDER — MIDAZOLAM HCL 2 MG/2ML IJ SOLN
INTRAMUSCULAR | Status: AC
  Filled 2012-02-02: qty 2

## 2012-02-02 MED ORDER — SODIUM CHLORIDE 0.9 % IV SOLN
INTRAVENOUS | Status: DC
  Administered 2012-02-02: 1000 mL via INTRAVENOUS

## 2012-02-02 MED ORDER — HEPARIN SODIUM (PORCINE) 1000 UNIT/ML IJ SOLN
INTRAMUSCULAR | Status: AC
  Filled 2012-02-02: qty 1

## 2012-02-02 MED ORDER — SODIUM CHLORIDE 0.9 % IV SOLN: 250.0000 mL | INTRAVENOUS | Status: DC

## 2012-02-02 MED ORDER — SODIUM CHLORIDE 0.9 % IJ SOLN: 3.0000 mL | INTRAMUSCULAR | Status: DC | PRN

## 2012-02-02 MED ORDER — CLOPIDOGREL BISULFATE 75 MG PO TABS: 75.0000 mg | ORAL_TABLET | ORAL | Status: DC

## 2012-02-02 MED ORDER — SODIUM CHLORIDE 0.9 % IV SOLN
1.0000 mL/kg/h | INTRAVENOUS | Status: AC
  Administered 2012-02-02: 1 mL/kg/h via INTRAVENOUS

## 2012-02-02 MED ORDER — CEFAZOLIN SODIUM 1-5 GM-% IV SOLN: 1.0000 g | INTRAVENOUS | Status: DC

## 2012-02-02 MED ORDER — DIAZEPAM 5 MG PO TABS
5.0000 mg | ORAL_TABLET | ORAL | Status: AC
  Administered 2012-02-02: 5 mg via ORAL
  Filled 2012-02-02: qty 1

## 2012-02-02 MED ORDER — FENTANYL CITRATE 0.05 MG/ML IJ SOLN
INTRAMUSCULAR | Status: AC
  Filled 2012-02-02: qty 2

## 2012-02-02 MED ORDER — SODIUM CHLORIDE 0.9 % IV SOLN: 250.0000 mL | INTRAVENOUS | Status: DC | PRN

## 2012-02-02 MED ORDER — ACETAMINOPHEN 325 MG PO TABS
650.0000 mg | ORAL_TABLET | ORAL | Status: DC | PRN
  Administered 2012-02-02: 18:00:00 650 mg via ORAL
  Filled 2012-02-02: qty 2

## 2012-02-02 MED ORDER — LIDOCAINE HCL (PF) 1 % IJ SOLN
INTRAMUSCULAR | Status: AC
  Filled 2012-02-02: qty 30

## 2012-02-02 MED ORDER — FERROUS SULFATE 325 (65 FE) MG PO TABS
325.0000 mg | ORAL_TABLET | Freq: Every evening | ORAL | Status: DC
  Administered 2012-02-02: 325 mg via ORAL
  Filled 2012-02-02 (×2): qty 1

## 2012-02-02 MED ORDER — CLOPIDOGREL BISULFATE 75 MG PO TABS
75.0000 mg | ORAL_TABLET | Freq: Every day | ORAL | Status: DC
  Administered 2012-02-02 – 2012-02-03 (×2): 75 mg via ORAL
  Filled 2012-02-02 (×2): qty 1

## 2012-02-02 MED ORDER — CEFAZOLIN SODIUM 1-5 GM-% IV SOLN
INTRAVENOUS | Status: AC
  Filled 2012-02-02: qty 50

## 2012-02-02 NOTE — H&P (View-Only) (Signed)
 HPI:  This is a 41-year-old woman presenting for followup evaluation. She has a history of PFO and recurrent stroke. She was initially seen in May 2012 and at that time she had 2 prior episodes of expressive aphasia and left-sided weakness. She was first diagnosed with a TIA in 2010 and she took Plavix for approximately 6 months prior to discontinuing it because of adverse effects. She has a true aspirin allergy. A CT scan of the brain showed a cerebellar infarct after his second TIA in 2011. She ultimately underwent a transesophageal echocardiogram demonstrating a tunneled patent foramen ovale with right-to-left shunt as well as a prominent eustachian valve.  The patient was last seen 09/13/2011 and she was scheduled for transcatheter PFO closure. However, her laboratory data demonstrated marked microcytic anemia and she has had to be treated with intravenous iron infusions prior to undergoing PFO closure in order to safely do the procedure. Of note, the patient is a Jehovah's Witness and therefore not a candidate for accurate blood cell transfusions in case of bleeding complications.  Her last hospitalization was in August 2012 when she presented with dizziness and right ear tinnitus as well as right scalp and facial numbness and some weakness in her right arm and leg. She had some difficulty in comprehension. An MRI of the brain showed no acute infarction but did show an old right cerebellar infarct. The patient has been on Plavix now for several months without bleeding problems. Her most recent hemoglobin June 12 was 9.0. This is improved from her previous hemoglobin of 7.4.  Outpatient Encounter Prescriptions as of 01/04/2012  Medication Sig Dispense Refill  . clopidogrel (PLAVIX) 75 MG tablet Take 1 tablet (75 mg total) by mouth daily.  90 tablet  4  . ferrous sulfate 325 (65 FE) MG tablet Take 325 mg by mouth daily with breakfast.      . DISCONTD: Multiple Vitamin (MULTIVITAMIN) tablet Take 1  tablet by mouth daily.          Allergies  Allergen Reactions  . Aspirin Anaphylaxis  . Lisinopril Hives and Swelling    Past Medical History  Diagnosis Date  . History of TIAs     09/2009 and 02/2011; no residual deficit  . 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 PFO (patent foramen ovale)   . Mitral regurgitation     mild  . Migraine headache   . Mild tricuspid regurgitation   . Asthma   . Chicken pox   . Allergy     hay fever  . Microcytic anemia 10/18/2011  . Dysmenorrhea   . Iron deficiency anemia due to chronic blood loss     ROS: Negative except as per HPI  BP 110/72  Pulse 84  Ht 5' 5" (1.651 m)  Wt 77.111 kg (170 lb)  BMI 28.29 kg/m2  PHYSICAL EXAM: Pt is alert and oriented, NAD HEENT: normal Neck: JVP - normal, carotids 2+= without bruits Lungs: CTA bilaterally CV: RRR without murmur or gallop Abd: soft, NT, Positive BS, no hepatomegaly Ext: no C/C/E, distal pulses intact and equal Skin: warm/dry no rash  ASSESSMENT AND PLAN: PFO with history of recurrent TIAs. Neuro imaging has documented a cerebellar stroke. Considering her young age, the fact that she has chronic anemia and is a Jehovah's Witness, and that she has had recurrent neurologic events, I feel transcatheter PFO closure is indicated. Her hemoglobin is now improved after receiving intravenous iron.   I have again reviewed the risks, indications, and alternatives to transcatheter PFO closure. This is been well documented in previous notes. The patient understands and would like to proceed with transcatheter closure of her PFO as soon as possible.  Jahira Swiss      

## 2012-02-02 NOTE — CV Procedure (Signed)
   Cardiac Catheterization Procedure Note  Name: Morgan Shaffer MRN: [id number] DOB: [birth year]  Procedure: Intracardiac echo, transcatheter PFO closure.  Indication: This is a 41 year old woman with chronic anemia. She has had a cryptogenic stroke and a recurrent TIA. She's had significant anemia with hemoglobin down to 7 and she is a poor candidate for long-term anticoagulation or antiplatelet therapy. Because of her recurrent neurologic events, she was referred for transcatheter PFO closure.   Procedural details: The right groin was prepped, draped, and anesthetized with 1% lidocaine.  Using the modified Seldinger technique, a 9 French sheath was placed in the right femoral vein and a second 6 French sheath was placed in the same right femoral vein. Both veins were accessed with a front wall puncture. An intracardiac echo probe was inserted and standard imaging was performed. An agitated saline contrast study was done and there was a very large spontaneous right to left shunt demonstrated across the interatrial septum. At that point, weight-based unfractionated heparin was administered. A second and a third dose of heparin had to be administered to achieve a therapeutic ACT. Once the ACT was greater than 200, an angled Glidewire was advanced across the interatrial septum into the left upper pulmonary vein. This was changed out with a multipurpose catheter to an exchange length Amplatz wire. A 24 mm sizing balloon was advanced with 3-1 dilute contrast and using normal technique the PFO was sized. There was a short tunnel and the defect measured 7 mm. By 20 mm Gore Helex device was chosen to perform closure. The sizing balloon was removed and an 10 French sheath was inserted. Using normal technique the device was advanced into the left atrium or the left disc was deployed. The device was pulled back snug against the left side of the septum and the right disc was deployed. Positioning was confirmed by  Roscoe B. and intracardiac echo. The device was released. A followup bubble study was performed and there was no residual shunting. The patient tolerated the procedure well. There were no immediate complications. She was transferred to the post cardiac cath recovery area in stable condition.   Procedural Findings:  Intracardiac echo demonstrated a large patent foramen ovale with a large spontaneous right to left intracardiac shunt documented by agitated saline study. No other significant structural heart disease was identified.  Final Conclusions:  Successful transcatheter closure of an interatrial septal communication as described above.  Recommendations: The patient is aspirin allergic. She should be continued on Plavix 75 mg daily for 6 months, then I would discontinue antiplatelet therapy considering her problems with chronic anemia. She should have a followup echocardiogram in chest x-ray tomorrow morning. As long as these studies are okay, she can be discharged tomorrow and she should have a followup office visit in one month with a repeat echo at that time. She can return to work in one week.  [name] 02/02/2012, 9:38 AM

## 2012-02-02 NOTE — Telephone Encounter (Signed)
New problem:  Per Barbara Cower, need duration of appt - regarding FMLA paperwork

## 2012-02-02 NOTE — Interval H&P Note (Signed)
History and Physical Interval Note:  02/02/2012 8:05 AM  Morgan Shaffer  has presented today for surgery, with the diagnosis of PFO  The various methods of treatment have been discussed with the patient and family. After consideration of risks, benefits and other options for treatment, the patient has consented to  Procedure(s) (LRB): PATENT FORAMEN OVALE CLOSURE (N/A) as a surgical intervention .  The patient's history has been reviewed, patient examined, no change in status, stable for surgery.  I have reviewed the patients' chart and labs.  Questions were answered to the patient's satisfaction.    The patient is well-known to me. She has a PFO with recurrent TIA. No changes since this H&P was done, with the exception of an emergency room visit for dizziness. She had an MRI of the brain that showed no acute stroke. I have reviewed extensively the risks, indications, and alternatives to transcatheter PFO closure. She understood and agreed to proceed. The patient has chronic anemia and her most recent hemoglobin was 8.7. She has been followed by hematology and has received intravenous iron. His hemoglobin has been stable for her. 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 This is all been discussed in detail prior to the procedure.   [name]  02/02/2012 8:05 AM

## 2012-02-02 NOTE — Telephone Encounter (Signed)
Left message to call back  

## 2012-02-02 NOTE — Progress Notes (Signed)
  Echocardiogram 2D Echocardiogram has been performed.  Shelton Soler 02/02/2012, 3:27 PM

## 2012-02-03 ENCOUNTER — Encounter (HOSPITAL_COMMUNITY): Payer: Self-pay | Admitting: Cardiology

## 2012-02-03 ENCOUNTER — Ambulatory Visit (HOSPITAL_COMMUNITY): Payer: 59

## 2012-02-03 DIAGNOSIS — Q211 Atrial septal defect: Secondary | ICD-10-CM

## 2012-02-03 LAB — CBC
Hemoglobin: 8.3 g/dL — ABNORMAL LOW (ref 12.0–15.0)
MCH: 19.6 pg — ABNORMAL LOW (ref 26.0–34.0)
Platelets: 314 10*3/uL (ref 150–400)
RBC: 4.23 MIL/uL (ref 3.87–5.11)
WBC: 5.4 10*3/uL (ref 4.0–10.5)

## 2012-02-03 LAB — BASIC METABOLIC PANEL
CO2: 21 mEq/L (ref 19–32)
Calcium: 9 mg/dL (ref 8.4–10.5)
Chloride: 104 mEq/L (ref 96–112)
Glucose, Bld: 98 mg/dL (ref 70–99)
Potassium: 3.9 mEq/L (ref 3.5–5.1)
Sodium: 136 mEq/L (ref 135–145)

## 2012-02-03 NOTE — Progress Notes (Signed)
Received order for PCI however pt is PFO dx. N/A for CR unless reordered for ambulation. Ethelda Chick CES, ACSM

## 2012-02-03 NOTE — Discharge Summary (Signed)
See full note this am. cdm 

## 2012-02-03 NOTE — Discharge Summary (Signed)
Discharge Summary   Patient ID: Morgan Shaffer MRN: [id number], DOB/AGE: 1971-01-01 41 y.o.  Primary MD: Rogelia Boga, MD Primary Cardiologist: Dr. Excell Seltzer Admit date: 02/02/2012 D/C date:     02/03/2012      Primary Discharge Diagnoses:  1. Patent Foramen Ovale  - s/p Transcatheter PFO closure 02/02/12 with 20mm Gore Helex device  - Plavix for 6mos then discontinue  - F/u office visit with echo in 1 month   Secondary Discharge Diagnoses:  Past Medical History  Diagnosis Date  . History of TIAs     09/2009 and 02/2011; no residual deficit  . 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 PFO (patent foramen ovale)   . Mitral regurgitation     mild  . Migraine headache   . Mild tricuspid regurgitation   . Asthma   . Chicken pox   . Allergy     hay fever  . Microcytic anemia 10/18/2011  . Dysmenorrhea   . Iron deficiency anemia due to chronic blood loss   . Shortness of breath   . Refusal of blood transfusions as patient is Jehovah's Witness      Allergies Allergies  Allergen Reactions  . Aspirin Anaphylaxis  . Penicillins Itching and Swelling  . Lisinopril Hives and Swelling    Diagnostic Studies/Procedures:   02/02/12 - Transcatheter PFO closure Procedural Findings: Intracardiac echo demonstrated a large patent foramen ovale with a large spontaneous right to left intracardiac shunt documented by agitated saline study. No other significant structural heart disease was identified.  Final Conclusions: Successful transcatheter closure of an interatrial septal communication as described above.  Recommendations: The patient is aspirin allergic. She should be continued on Plavix 75 mg daily for 6 months, then I would discontinue antiplatelet therapy considering her problems with chronic anemia. She should have a followup echocardiogram in chest x-ray tomorrow morning. As long as these studies are okay, she can be discharged  tomorrow and she should have a followup office visit in one month with a repeat echo at that time. She can return to work in one week.  02/02/12 - 2D Echocardiogram Study Conclusions: - Left ventricle: The cavity size was normal. Wall thickness was normal. Systolic function was normal. The estimated ejection fraction was in the range of 60% to 65%. - Pulmonary arteries: PA peak pressure: 33mm Hg (S). - Atrial septum: The atrial septal closure device can be visualized in the atrial septum.  02/03/12 - CXR IMPRESSION: 1. Cardiac PFO occluder device in place. 2. Possible trace pleural effusions but otherwise no acute cardiopulmonary abnormality.    History of Present Illness: 41 y.o. female w/ the above medical problems who presented to [facility] on [facility]02[facility]12 for planned closure of PFO.  She has had a cryptogenic stroke and recurrent TIA. 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 Because of her recurrent neurologic events, she was referred for transcatheter PFO closure.   Hospital Course: She presented to [facility] on 7/11 and underwent transcatheter PFO closure without complications. Recommendations were made for continuation of Plavix for 6mos then discontinue given problems with chronic anemia. Follow up echo revealed atrial septal closure device intact, nl LV systolic fxn, EF 40-98%, PASP 33mmHg. Post op CXR was without acute findings.  H&H was stable. She was seen and evaluated by Dr. Clifton James who felt she was stable for discharge home with plans for follow up as scheduled below. She can return to work in one week. Will have follow up echo at next office visit.  Discharge Vitals: Blood pressure 134/81, pulse 96, temperature 97.7 F (36.5 C), temperature source Oral, resp. rate 23,  height 5\' 5"  (1.651 m), weight 141 lb 12.8 oz (64.32 kg), last menstrual period 02/02/2012, SpO2 99.00%.  Labs:  Component Value Date   WBC 5.4 02/03/2012   HGB 8.3* 02/03/2012   HCT 28.8* 02/03/2012   MCV 68.1* 02/03/2012   PLT 314 02/03/2012    Lab 02/03/12 0607  NA 136  K 3.9  CL 104  CO2 21  BUN 8  CREATININE 0.89  CALCIUM 9.0  GLUCOSE 98    Discharge Medications   Medication List  As of 02/03/2012 10:04 AM   TAKE these medications         clopidogrel 75 MG tablet   Commonly known as: PLAVIX   Take 75 mg by mouth every evening.      ferrous sulfate 325 (65 FE) MG tablet   Take 325 mg by mouth every evening.            Disposition   Discharge Orders    Future Appointments: Provider: Department: Dept Phone: Center:   02/17/2012 10:45 AM Krista Blue [facility] [phone] None   02/17/2012 11:15 AM Myrtis Ser, NP [facility] [phone] None   03/06/2012 9:30 AM [facility] 2 [facility] 3 Echo Lab  None   03/06/2012 10:30 AM [name], MD [facility] [phone] [facility]   06/22/2012 9:45 AM Krista Blue [facility] [phone] None   06/22/2012 10:15 AM Exie Parody, MD [facility] [phone] None     Future Orders Please Complete By Expires   Diet - low sodium heart healthy      Increase activity slowly      Discharge instructions      Comments:   **PLEASE REMEMBER TO BRING ALL OF YOUR MEDICATIONS TO EACH OF YOUR FOLLOW-UP OFFICE VISITS.  * KEEP GROIN SITE CLEAN AND DRY. Call the office for any signs of bleedings, pus, swelling, increased pain, or any other concerns.  * You may return to work in one week.     Follow-up Information    Follow up with [name], MD on 03/06/2012. (Echo at 9:30; Appointment at 10:30)    Contact information:   [facility] [address] [place] [address] [phone]           Outstanding Labs/Studies:  1. Echo at follow up  visit  Duration of Discharge Encounter: Greater than 30 minutes including physician and PA time.  Signed, Virlee Stroschein PA-C 02/03/2012, 10:04 AM

## 2012-02-03 NOTE — Progress Notes (Signed)
    SUBJECTIVE: no complaints this am. No chest pain or SOB. No events.   BP 108/71  Pulse 71  Temp 97.7 F (36.5 C) (Oral)  Resp 19  Ht 5\' 5"  (1.651 m)  Wt 141 lb 12.8 oz (64.32 kg)  BMI 23.60 kg/m2  SpO2 99%  LMP 02/02/2012  Intake/Output Summary (Last 24 hours) at 02/03/12 0716 Last data filed at 02/02/12 1900  Gross per 24 hour  Intake 784.05 ml  Output      0 ml  Net 784.05 ml    PHYSICAL EXAM General: Well developed, well nourished, in no acute distress. Alert and oriented x 3.  Psych:  Good affect, responds appropriately Neck: No JVD. No masses noted.  Lungs: Clear bilaterally with no wheezes or rhonci noted.  Heart: RRR with no murmurs noted. Abdomen: Bowel sounds are present. Soft, non-tender.  Extremities: No lower extremity edema. Right groin cath site without hematoma.   LABS: Basic Metabolic Panel:  Basename 02/03/12 0607  NA 136  K 3.9  CL 104  CO2 21  GLUCOSE 98  BUN 8  CREATININE 0.89  CALCIUM 9.0  MG --  PHOS --   CBC:  Basename 02/03/12 0607  WBC 5.4  NEUTROABS --  HGB 8.3*  HCT 28.8*  MCV 68.1*  PLT 314    Current Meds:    . ceFAZolin      . clopidogrel  75 mg Oral Q breakfast  . fentaNYL      . fentaNYL      . ferrous sulfate  325 mg Oral QPM  . heparin      . heparin      . heparin      . lidocaine      . midazolam      . midazolam      . midazolam      . sodium chloride  3 mL Intravenous Q12H  . DISCONTD:  ceFAZolin (ANCEF) IV  1 g Intravenous To Cath  . DISCONTD: clopidogrel  75 mg Oral Pre-Cath  . DISCONTD: sodium chloride  3 mL Intravenous Q12H     ASSESSMENT AND PLAN: This is a 41 year old woman with chronic anemia. She has had a cryptogenic stroke and a recurrent TIA. She's had significant anemia with hemoglobin down to 7 and she is a poor candidate for long-term anticoagulation or antiplatelet therapy. Because of her recurrent neurologic events, she was referred for transcatheter PFO closure. This was  performed per Dr. Excell Seltzer on 02/02/12.    1. PFO with recurrent TIA: now s/p PFO closure yesterday per Dr. Excell Seltzer with a 20mm Gore Helex device. She has done well. Echo yesterday post procedure showed that the device is in place. Plans for Plavix for 6 months. Chest x-ray report pending this am. If chest x-ray is ok, can d/c home this am.  2. Dispo:  Follow up with Dr. Excell Seltzer in one month with repeat echo the day of his office visit. She can return to work in one week.     Blue Ruggerio  7/12/20137:16 AM

## 2012-02-09 NOTE — Telephone Encounter (Signed)
No return call from [place], will close encounter.

## 2012-02-10 ENCOUNTER — Telehealth: Payer: Self-pay | Admitting: Cardiovascular Disease

## 2012-02-10 NOTE — Telephone Encounter (Signed)
Agree - I suspect this is unrelated to her procedure last week. Agree with trial of an antihistamine.  [name] 02/10/2012 11:48 AM

## 2012-02-10 NOTE — Telephone Encounter (Signed)
I spoke with the pt and she complained of both hands tingling when she woke up yesterday morning.  As the day progressed her hands started itching.  The pt said it feels like her hands are itching from the inside out. The pt does not have a rash.   I made the pt aware that I do not think this is related to her PFO Closure.  The pt denies coming in contact with any new lotions on her hands. I made the pt aware that she can try Claritin or Benadryl to see if this helps her itching.

## 2012-02-10 NOTE — Telephone Encounter (Signed)
Pt calling re pfo closure about a week ago , pt's hands itching really bad, nothing on skin

## 2012-02-17 ENCOUNTER — Ambulatory Visit (HOSPITAL_BASED_OUTPATIENT_CLINIC_OR_DEPARTMENT_OTHER): Payer: 59 | Admitting: Oncology

## 2012-02-17 ENCOUNTER — Telehealth: Payer: Self-pay | Admitting: Oncology

## 2012-02-17 ENCOUNTER — Encounter: Payer: Self-pay | Admitting: Oncology

## 2012-02-17 ENCOUNTER — Other Ambulatory Visit (HOSPITAL_BASED_OUTPATIENT_CLINIC_OR_DEPARTMENT_OTHER): Payer: 59 | Admitting: Lab

## 2012-02-17 VITALS — BP 110/75 | HR 68 | Temp 97.7°F | Ht 65.0 in | Wt 177.1 lb

## 2012-02-17 DIAGNOSIS — D509 Iron deficiency anemia, unspecified: Secondary | ICD-10-CM

## 2012-02-17 LAB — COMPREHENSIVE METABOLIC PANEL
AST: 15 U/L (ref 0–37)
Albumin: 4.2 g/dL (ref 3.5–5.2)
Alkaline Phosphatase: 86 U/L (ref 39–117)
BUN: 9 mg/dL (ref 6–23)
Calcium: 9.3 mg/dL (ref 8.4–10.5)
Chloride: 108 mEq/L (ref 96–112)
Glucose, Bld: 94 mg/dL (ref 70–99)
Potassium: 3.8 mEq/L (ref 3.5–5.3)
Sodium: 137 mEq/L (ref 135–145)
Total Protein: 7.2 g/dL (ref 6.0–8.3)

## 2012-02-17 LAB — CBC WITH DIFFERENTIAL/PLATELET
Basophils Absolute: 0 10*3/uL (ref 0.0–0.1)
Eosinophils Absolute: 0.1 10*3/uL (ref 0.0–0.5)
HGB: 8.8 g/dL — ABNORMAL LOW (ref 11.6–15.9)
NEUT#: 2.9 10*3/uL (ref 1.5–6.5)
RBC: 4.58 10*6/uL (ref 3.70–5.45)
RDW: 21.7 % — ABNORMAL HIGH (ref 11.2–14.5)
WBC: 4.4 10*3/uL (ref 3.9–10.3)
lymph#: 1.1 10*3/uL (ref 0.9–3.3)

## 2012-02-17 NOTE — Telephone Encounter (Signed)
Gave pt appt for lab only in September 2013

## 2012-02-17 NOTE — Progress Notes (Signed)
[facility]  [phone] [phone]   OFFICE PROGRESS NOTE   Cc:  Rogelia Boga, MD  DIAGNOSIS: Iron deficiency anemia  PAST THERAPY: She was given Feraheme 1,020 mg on 10/21/11. She had a significant hypersensitivity reaction and did not receive the entire dose. She has not been re challenged.   CURRENT THERAPY: Ferrous sulfate 325 mg daily.  INTERVAL HISTORY: Morgan Shaffer 41 y.o. female returns for routine follow-up by herself. Since we last saw her, she had closure of her PFO. She tolerated the procedure well. She has noticed that she has had more migraine headaches from the procedure; she is not sure if it is related, but plans to talk with Dr Excell Seltzer about this at her next visit. With regards to her anemia, she is not experiencing excessive fatigue, chest pain, shortness of breath, or dyspnea. She continues to have heavy periods typically for 2 days, then lighter for a total of five days. She is not having any side effects from the oral iron.   Patient denies headache, visual changes, confusion, drenching night sweats, palpable lymph node swelling, mucositis, odynophagia, dysphagia, nausea vomiting, jaundice, chest pain, palpitation, shortness of breath, dyspnea on exertion, productive cough, gum bleeding, epistaxis, hematemesis, hemoptysis, abdominal pain, abdominal swelling, early satiety, melena, hematochezia, hematuria, skin rash, spontaneous bleeding, joint swelling, joint pain, heat or cold intolerance, bowel bladder incontinence, back pain, focal motor weakness, paresthesia, depression, suicidal or homocidal ideation, feeling hopelessness.   Past Medical History  Diagnosis Date  . History of TIAs     09/2009 and 02/2011; no residual deficit  . 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 PFO (patent foramen ovale)   . Mitral regurgitation     mild  . Migraine headache   . Mild tricuspid  regurgitation   . Asthma   . Chicken pox   . Allergy     hay fever  . Microcytic anemia 10/18/2011  . Dysmenorrhea   . Iron deficiency anemia due to chronic blood loss   . Shortness of breath   . Refusal of blood transfusions as patient is Jehovah's Witness     Past Surgical History  Procedure Date  . Patent foramen ovale closure 02/02/12    Current Outpatient Prescriptions  Medication Sig Dispense Refill  . clopidogrel (PLAVIX) 75 MG tablet Take 75 mg by mouth every evening.      . ferrous sulfate 325 (65 FE) MG tablet Take 325 mg by mouth every evening.         ALLERGIES:  is allergic to aspirin; penicillins; and lisinopril.  REVIEW OF SYSTEMS:  The rest of the 14-point review of system was negative.   Filed Vitals:   02/17/12 1133  BP: 110/75  Pulse: 68  Temp: 97.7 F (36.5 C)   Wt Readings from Last 3 Encounters:  02/17/12 177 lb 1.6 oz (80.332 kg)  02/03/12 141 lb 12.8 oz (64.32 kg)  02/03/12 141 lb 12.8 oz (64.32 kg)   ECOG Performance status: 0  PHYSICAL EXAMINATION: General:  well-nourished in no acute distress.  Eyes:  no scleral icterus.  ENT:  There were no oropharyngeal lesions.  Neck was without thyromegaly.  Lymphatics:  Negative cervical, supraclavicular or axillary adenopathy.  Respiratory: lungs were clear bilaterally without wheezing or crackles.  Cardiovascular:  Regular rate and rhythm, S1/S2, without murmur, rub or gallop.  There was no pedal edema.  GI:  abdomen was soft,  flat, nontender, nondistended, without organomegaly.  Muscoloskeletal:  no spinal tenderness of palpation of vertebral spine.  Skin exam was without echymosis, petichae.  Neuro exam was nonfocal.  Patient was able to get on and off exam table without assistance.  Gait was normal.  Patient was alerted and oriented.  Attention was good.   Language was appropriate.  Mood was normal without depression.  Speech was not pressured.  Thought content was not tangential.    LABORATORY/RADIOLOGY  DATA:  Lab Results  Component Value Date   WBC 4.4 02/17/2012   HGB 8.8* 02/17/2012   HCT 30.4* 02/17/2012   PLT 501* 02/17/2012   GLUCOSE 98 02/03/2012   CHOL 167 03/23/2011   TRIG 103 03/23/2011   HDL 57 03/23/2011   LDLCALC 89 03/23/2011   ALKPHOS 101 01/24/2012   ALT 8 01/24/2012   AST 15 01/24/2012   NA 136 02/03/2012   K 3.9 02/03/2012   CL 104 02/03/2012   CREATININE 0.89 02/03/2012   BUN 8 02/03/2012   CO2 21 02/03/2012   INR 1.14 01/24/2012   HGBA1C 6.0* 03/22/2011    ASSESSMENT AND PLAN:   1. CVA, TIA's: She is on Plavix per PCP. She had reaction to aspirin and beta blocker.  2. Patent PFO: S/P closure in July 2013. 3. Dysmenorrhea: She is afraid to take oral contraceptives given his of CVA/TIA's in the past. She follows with Dr. Henreitta Leber. I wonder if patient is a candidate for endometrial ablation given her severe chronic iron deficiency anemia from menstrual blood loss. I defer to Dr. Roanna Banning expertise.  4. Iron deficiency anemia. I have low concern for hemoglobinopathy since her Hgb in 2011 was normal. There is low clinical concern for hemolysis per lab. I also have low concern for primary bone marrow failure state. Ferritin was 2 in March 2013 and is pending today. She does not accept blood transfusion being Jehova's witness. She reacted to [facility] in the past. She is asymptomatic from her anemia. Recommend that she continue oral ferrous sulfate. I have encouraged her to take this three times a day with Vitamin C to increase absorption. I also recommended that she try a liquids form of ferrous sulfate. If she becomes symptomatic from her anemia, then we can consider re-challenging her with IV iron with pre medications to minimize risk of reaction. 5. Follow up. CBC in 2 months and keep follow-up with Dr Gaylyn Rong in 4 months.    The length of time of the face-to-face encounter was 30 minutes. More than 50% of time was spent counseling and coordination of care.

## 2012-03-05 ENCOUNTER — Other Ambulatory Visit (HOSPITAL_COMMUNITY): Payer: Self-pay | Admitting: Cardiovascular Disease

## 2012-03-05 DIAGNOSIS — Q211 Atrial septal defect: Secondary | ICD-10-CM

## 2012-03-06 ENCOUNTER — Ambulatory Visit (HOSPITAL_COMMUNITY): Payer: 59 | Attending: Cardiovascular Disease | Admitting: Radiology

## 2012-03-06 ENCOUNTER — Ambulatory Visit (INDEPENDENT_AMBULATORY_CARE_PROVIDER_SITE_OTHER): Payer: 59 | Admitting: Cardiovascular Disease

## 2012-03-06 ENCOUNTER — Other Ambulatory Visit: Payer: Self-pay

## 2012-03-06 VITALS — BP 126/78 | HR 66 | Ht 65.0 in | Wt 177.0 lb

## 2012-03-06 DIAGNOSIS — J45909 Unspecified asthma, uncomplicated: Secondary | ICD-10-CM | POA: Insufficient documentation

## 2012-03-06 DIAGNOSIS — Q211 Atrial septal defect: Secondary | ICD-10-CM | POA: Insufficient documentation

## 2012-03-06 DIAGNOSIS — I059 Rheumatic mitral valve disease, unspecified: Secondary | ICD-10-CM | POA: Insufficient documentation

## 2012-03-06 DIAGNOSIS — Q2111 Secundum atrial septal defect: Secondary | ICD-10-CM | POA: Insufficient documentation

## 2012-03-06 DIAGNOSIS — Z8673 Personal history of transient ischemic attack (TIA), and cerebral infarction without residual deficits: Secondary | ICD-10-CM | POA: Insufficient documentation

## 2012-03-06 DIAGNOSIS — I517 Cardiomegaly: Secondary | ICD-10-CM | POA: Insufficient documentation

## 2012-03-06 NOTE — Patient Instructions (Addendum)
Your physician wants you to follow-up in: February 2014 (5 MONTHS) You will receive a reminder letter in the mail two months in advance. If you  don't receive a letter, please call our office to schedule the follow-up appointment.  Your physician recommends that you continue on your current medications as directed. Please refer to the Current Medication list given to you today.

## 2012-03-06 NOTE — Progress Notes (Signed)
Echocardiogram performed.  

## 2012-03-20 ENCOUNTER — Encounter: Payer: Self-pay | Admitting: Cardiovascular Disease

## 2012-03-20 NOTE — Progress Notes (Signed)
   HPI:  41 year-old woman presenting for followup after transcatheter PFO closure. Her procedure was performed 02/02/2012 and she was treated with a 20 mm Gore Helex device. Followup echocardiogram performed the day of this office visit demonstrated normal left ventricular size and systolic function with appropriate position of her closure device was no shunt by color flow.  The patient has continued to have headaches. She's had no other localizing neurologic symptoms. Her headaches have slowly improved over the last few weeks. She otherwise feels very well. She denies chest pain, dyspnea, palpitations, lightheadedness, or syncope. She's been compliant with Plavix and reports no bleeding problems.  Outpatient Encounter Prescriptions as of 03/06/2012  Medication Sig Dispense Refill  . clopidogrel (PLAVIX) 75 MG tablet Take 75 mg by mouth every evening.      . ferrous sulfate 325 (65 FE) MG tablet Take 325 mg by mouth every evening.         Allergies  Allergen Reactions  . Aspirin Anaphylaxis  . Penicillins Itching and Swelling  . Lisinopril Hives and Swelling    Past Medical History  Diagnosis Date  . History of TIAs     09/2009 and 02/2011; no residual deficit  . 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 PFO (patent foramen ovale)   . Mitral regurgitation     mild  . Migraine headache   . Mild tricuspid regurgitation   . Asthma   . Chicken pox   . Allergy     hay fever  . Microcytic anemia 10/18/2011  . Dysmenorrhea   . Iron deficiency anemia due to chronic blood loss   . Shortness of breath   . Refusal of blood transfusions as patient is Jehovah's Witness     ROS: Negative except as per HPI  BP 126/78  Pulse 66  Ht 5\' 5"  (1.651 m)  Wt 177 lb (80.287 kg)  BMI 29.45 kg/m2  PHYSICAL EXAM: Pt is alert and oriented, NAD HEENT: normal Neck: JVP - normal, carotids 2+= without bruits Lungs: CTA bilaterally CV: RRR without murmur or  gallop Abd: soft, NT, Positive BS, no hepatomegaly Ext: no C/C/E, distal pulses intact and equal Skin: warm/dry no rash  EKG: Normal sinus rhythm 66 beats per minute, within normal limits.  2-D echo: Study Conclusions  - Left ventricle: The cavity size was normal. Wall thickness was normal. Systolic function was normal. The estimated ejection fraction was in the range of 55% to 60%. Wall motion was normal; there were no regional wall motion abnormalities. Doppler parameters are consistent with abnormal left ventricular relaxation (grade 1 diastolic dysfunction). - Mitral valve: Mild regurgitation. - Left atrium: The atrium was mildly dilated. - Atrial septum: ASD closure device appears to be in good position with no shunt by color flow. Bubble study not done  ASSESSMENT AND PLAN:

## 2012-03-20 NOTE — Assessment & Plan Note (Signed)
The patient is stable. I would like her to continue on Plavix for a total of 6 months from the time her defect was closed. I will see her back in followup at that time and will likely discontinue Plavix. She understands that she needs to follow SBE prophylaxis for a period of 6 months. I have encouraged her to followup with hematology regarding her chronic iron deficiency anemia. I will see her back in 5 months.

## 2012-04-19 ENCOUNTER — Other Ambulatory Visit: Payer: 59 | Admitting: Lab

## 2012-06-19 ENCOUNTER — Emergency Department (HOSPITAL_COMMUNITY): Payer: 59

## 2012-06-19 ENCOUNTER — Encounter (HOSPITAL_COMMUNITY): Payer: Self-pay | Admitting: Emergency Medicine

## 2012-06-19 ENCOUNTER — Telehealth: Payer: Self-pay | Admitting: Cardiovascular Disease

## 2012-06-19 ENCOUNTER — Emergency Department (HOSPITAL_COMMUNITY)
Admission: EM | Admit: 2012-06-19 | Discharge: 2012-06-19 | Disposition: A | Discharge status: Home / Self Care | Payer: 59 | Attending: Emergency Medicine | Admitting: Emergency Medicine

## 2012-06-19 DIAGNOSIS — R202 Paresthesia of skin: Secondary | ICD-10-CM

## 2012-06-19 DIAGNOSIS — R209 Unspecified disturbances of skin sensation: Secondary | ICD-10-CM | POA: Insufficient documentation

## 2012-06-19 DIAGNOSIS — R5383 Other fatigue: Secondary | ICD-10-CM | POA: Insufficient documentation

## 2012-06-19 DIAGNOSIS — R5381 Other malaise: Secondary | ICD-10-CM | POA: Insufficient documentation

## 2012-06-19 DIAGNOSIS — R42 Dizziness and giddiness: Secondary | ICD-10-CM | POA: Insufficient documentation

## 2012-06-19 DIAGNOSIS — Z8673 Personal history of transient ischemic attack (TIA), and cerebral infarction without residual deficits: Secondary | ICD-10-CM | POA: Insufficient documentation

## 2012-06-19 LAB — CBC WITH DIFFERENTIAL/PLATELET
Basophils Absolute: 0 10*3/uL (ref 0.0–0.1)
Basophils Relative: 0 % (ref 0–1)
Eosinophils Absolute: 0.1 10*3/uL (ref 0.0–0.7)
Eosinophils Relative: 1 % (ref 0–5)
HCT: 29.8 % — ABNORMAL LOW (ref 36.0–46.0)
Hemoglobin: 8.9 g/dL — ABNORMAL LOW (ref 12.0–15.0)
Lymphocytes Relative: 28 % (ref 12–46)
Lymphs Abs: 1.5 10*3/uL (ref 0.7–4.0)
MCH: 19.7 pg — ABNORMAL LOW (ref 26.0–34.0)
MCHC: 29.9 g/dL — ABNORMAL LOW (ref 30.0–36.0)
MCV: 66.1 fL — ABNORMAL LOW (ref 78.0–100.0)
Monocytes Absolute: 0.3 10*3/uL (ref 0.1–1.0)
Monocytes Relative: 6 % (ref 3–12)
Neutro Abs: 3.6 10*3/uL (ref 1.7–7.7)
Neutrophils Relative %: 65 % (ref 43–77)
Platelets: 439 10*3/uL — ABNORMAL HIGH (ref 150–400)
RBC: 4.51 MIL/uL (ref 3.87–5.11)
RDW: 19.3 % — ABNORMAL HIGH (ref 11.5–15.5)
WBC: 5.5 10*3/uL (ref 4.0–10.5)

## 2012-06-19 LAB — PROTIME-INR
INR: 1 (ref 0.00–1.49)
Prothrombin Time: 13.1 seconds (ref 11.6–15.2)

## 2012-06-19 LAB — BASIC METABOLIC PANEL
BUN: 7 mg/dL (ref 6–23)
CO2: 21 mEq/L (ref 19–32)
Calcium: 9.6 mg/dL (ref 8.4–10.5)
Chloride: 104 mEq/L (ref 96–112)
Creatinine, Ser: 0.75 mg/dL (ref 0.50–1.10)
GFR calc Af Amer: >90 mL/min (ref >90)
GFR calc non Af Amer: >90 mL/min (ref >90)
Glucose, Bld: 127 mg/dL — ABNORMAL HIGH (ref 70–99)
Potassium: 3.5 mEq/L (ref 3.5–5.1)
Sodium: 137 mEq/L (ref 135–145)

## 2012-06-19 LAB — GLUCOSE, CAPILLARY: Glucose-Capillary: 131 mg/dL — ABNORMAL HIGH (ref 70–99)

## 2012-06-19 LAB — POCT I-STAT TROPONIN I: Troponin i, poc: 0 ng/mL (ref 0.00–0.08)

## 2012-06-19 NOTE — ED Notes (Signed)
Pt reports history of TIA. States that she felt dizzy and had left sided numbness and tingling after taking a shower last night at 2120.  Reports symptoms are the same when she had her previous stroke. Pt states that numbness has resolved, but she still has some weakness in left arm. Pt states that dizziness has lessened, but that she still feels slightly dizzy. Pt is neuro intact. Alert and oriented x 4. Pt denies dysuria, abdominal pain, or headache.

## 2012-06-19 NOTE — ED Notes (Signed)
Pt returned from radiology and placed back on monitor

## 2012-06-19 NOTE — Telephone Encounter (Signed)
Pt having numbness in arm, including hand and fingers, denies chest pain, pressure, pls advise

## 2012-06-19 NOTE — Telephone Encounter (Signed)
I spoke with the pt and she complained of left arm numbness that lasted all evening yesterday.  The pt had no other associated symptoms. The pt denied headache, dizziness, visual changes and left leg weakness.  Today the pt just complains of left arm weakness. The pt can grip and pick things up with her left arm.  The pt does not follow with a neurologist.  I will forward this message to Dr Excell Seltzer for review.

## 2012-06-19 NOTE — ED Notes (Signed)
Pt c/o left arm numbness and tingling starting yesterday and some dizziness starting today; pt sts hx of stroke and TIA in past; no neuro deficits noted at present

## 2012-06-19 NOTE — ED Notes (Signed)
Patient transported to MRI 

## 2012-06-19 NOTE — ED Provider Notes (Signed)
History     CSN: [id number]  Arrival date & time 06/19/12  1528   First MD Initiated Contact with Patient 06/19/12 1621      Chief Complaint  Patient presents with  . Weakness  . Numbness  . Dizziness    (Consider location/radiation/quality/duration/timing/severity/associated sxs/prior treatment) Patient is a 41 y.o. female presenting with neurologic complaint. The history is provided by the patient.  Neurologic Problem The primary symptoms include dizziness and focal weakness. Primary symptoms do not include nausea or vomiting. The symptoms began yesterday. The symptoms are improving. The neurological symptoms are focal. Context: Spontaneously.  She describes the dizziness as lightheadedness. Onset quality: 1.5 hours ago. The dizziness has been unchanged since its onset. Associated with: Spontaneous. Dizziness does not occur with blurred vision, tinnitus, hearing loss, nausea, vomiting, weakness or diaphoresis.  Weakness began greater than 24 hours ago. The weakness is improving. There is near normal muscle function with maximum physical effort.  Region/motion of weakness: Left arm use.  Additional symptoms do not include weakness or tinnitus.    Past Medical History  Diagnosis Date  . History of TIAs     09/2009 and 02/2011; no residual deficit  . 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 PFO (patent foramen ovale)   . Mitral regurgitation     mild  . Migraine headache   . Mild tricuspid regurgitation   . Asthma   . Chicken pox   . Allergy     hay fever  . Microcytic anemia 10/18/2011  . Dysmenorrhea   . Iron deficiency anemia due to chronic blood loss   . Shortness of breath   . Refusal of blood transfusions as patient is Jehovah's Witness     Past Surgical History  Procedure Date  . Patent foramen ovale closure 02/02/12    Family History  Problem Relation Age of Onset  . Hypertension      positive family hx of  . Alcohol  abuse    . Arrhythmia Mother   . Arthritis Maternal Grandmother   . Alcohol abuse Maternal Grandfather   . Alcohol abuse Paternal Grandmother   . Cancer Maternal Uncle     Prostate Cancer  . Cancer Maternal Uncle     Prostate Cancer    History  Substance Use Topics  . Smoking status: Never Smoker   . Smokeless tobacco: Never Used  . Alcohol Use: Yes     Comment: ocassional    OB History    Grav Para Term Preterm Abortions TAB SAB Ect Mult Living                  Review of Systems  Constitutional: Negative for diaphoresis.  HENT: Negative for tinnitus.   Eyes: Negative for blurred vision.  Gastrointestinal: Negative for nausea and vomiting.  Neurological: Positive for dizziness and focal weakness. Negative for weakness.  All other systems reviewed and are negative.    Allergies  Aspirin; Lisinopril; and Penicillins  Home Medications   Current Outpatient Rx  Name  Route  Sig  Dispense  Refill  . CLOPIDOGREL BISULFATE 75 MG PO TABS   Oral   Take 75 mg by mouth every evening.           BP 175/105  Pulse 84  Temp 97.8 F (36.6 C) (Oral)  Resp 18  SpO2 100%  Physical Exam  Nursing note and vitals reviewed. Constitutional: She is oriented to person, place,  and time. She appears well-developed and well-nourished. No distress.  HENT:  Head: Normocephalic and atraumatic.  Eyes: EOM are normal. Pupils are equal, round, and reactive to light.  Neck: Normal range of motion. Neck supple.  Cardiovascular: Normal rate and regular rhythm.  Exam reveals no friction rub.   No murmur heard. Pulmonary/Chest: Effort normal and breath sounds normal. No respiratory distress. She has no wheezes. She has no rales.  Abdominal: Soft. She exhibits no distension. There is no tenderness. There is no rebound.  Musculoskeletal: Normal range of motion. She exhibits no edema.  Neurological: She is alert and oriented to person, place, and time. No cranial nerve deficit. She exhibits  normal muscle tone. Coordination and gait normal.  Skin: She is not diaphoretic.    ED Course  Procedures (including critical care time)  Labs Reviewed  CBC WITH DIFFERENTIAL - Abnormal; Notable for the following:    Hemoglobin 8.9 (*)     HCT 29.8 (*)     MCV 66.1 (*)     MCH 19.7 (*)     MCHC 29.9 (*)     RDW 19.3 (*)     Platelets 439 (*)     All other components within normal limits  GLUCOSE, CAPILLARY - Abnormal; Notable for the following:    Glucose-Capillary 131 (*)     All other components within normal limits  PROTIME-INR  POCT I-STAT TROPONIN I  BASIC METABOLIC PANEL   Ct Head Wo Contrast  06/19/2012  *RADIOLOGY REPORT*  Clinical Data: Weakness, numbness, dizziness.  CT HEAD WITHOUT CONTRAST  Technique:  Contiguous axial images were obtained from the base of the skull through the vertex without contrast.  Comparison: MRI 01/24/2012.  Findings: No acute intracranial abnormality.  Specifically, no hemorrhage, hydrocephalus, mass lesion, acute infarction, or significant intracranial injury.  No acute calvarial abnormality. Visualized paranasal sinuses and mastoids clear.  Orbital soft tissues unremarkable.  IMPRESSION: No acute intracranial abnormality.   Original Report Authenticated By: Charlett Nose, M.D.    Mr Brain Wo Contrast  06/19/2012  *RADIOLOGY REPORT*  Clinical Data: 41 year old female with dizziness and left upper extremity weakness.  History of right cerebellar infarct.  MRI HEAD WITHOUT CONTRAST  Technique:  Multiplanar, multiecho pulse sequences of the brain and surrounding structures were obtained according to standard protocol without intravenous contrast.  Comparison: 01/24/2012, 03/22/2011.  Findings: No restricted diffusion to suggest acute infarction.  No midline shift, mass effect, evidence of mass lesion, ventriculomegaly, extra-axial collection or acute intracranial hemorrhage.  Cervicomedullary junction and pituitary are within normal limits.  Major  intracranial vascular flow voids are stable. Gray-white matter signal in the cerebral hemispheres is stable and within normal limits for age.  Small chronic lacunar infarcts in the right cerebellum are unchanged.  Brain stem and visualized cervical spine remain normal.  Grossly stable and normal visualized internal auditory structures. Visualized orbit soft tissues are within normal limits.  Stable paranasal sinuses and mastoids.  Stable bone marrow signal. Negative scalp soft tissues.  IMPRESSION: Stable. No acute intracranial abnormality.  Small chronic right cerebellar infarct.   Original Report Authenticated By: Erskine Speed, M.D.      1. Dizziness   2. Numbness and tingling in left arm       MDM  Patient is a 41 year old female with history of TIAs and prior stroke due to a patent foramen ovale. Patient here reporting left arm numbness/weakness MDM last night, has been constant, and is resolving. No further weakness now, however  states and tingling feeling in her lower arm. Patient also reports dizziness that started about an hour and a half ago. She states this is the same dizziness she felt when she had a previous stroke. On arrival vital signs are stable, patient is doing well. No neurologic deficits I could note. Normal gait, normal cranial nerves, normal strength and sensation in all extremities.Marland Kitchen NIH stroke scale 0 at this time. Concern for possible TIA. No need for code stroke. Initial head CT normal. I spoke with Dr. Thad Ranger of neurology states we should press for an MRI. MRI obtained and is negative. No stroke noted. Patient is not have a neurologist, so she was given neurologist followup here. Patient discharged home in stable condition.  Elwin Mocha, MD 06/20/12 [phone]

## 2012-06-20 NOTE — Telephone Encounter (Signed)
I spoke with the pt and she went into the ER yesterday for dizziness and arm weakness.  The pt has been scheduled to see a Neurologist. I instructed the pt to keep that appointment for further evaluation. Pt agreed with plan.

## 2012-06-20 NOTE — ED Provider Notes (Signed)
I saw and evaluated the patient, reviewed Dr. Tyson Alias note and I agree with the findings and plan.  The patient presents here with the sensation of the numbness in the left arm and hand.  This started earlier today.  She denies weakness or headache and denies involvement of the face or legs.  She has been diagnosed with tia's in the past and underwent testing which revealed a pfo.  This was closed approximately 6months ago and has been doing well since that time.    On exam, the patient is afebrile and the vitals are stable.  She is awake, alert, and oriented and is in no distress.  The heart and lung exam are unremarkable without murmurs or rales.  The abdomen is benign.  Neurologically, the exam is non-focal.  Cranial nerves are intact and the strength is 5/5 in all extremities.    Workup was initiated which consisted of labs, ct of the head, and ekg.  These were all unremarkable.  Due to the history of tia and pfo, the care was discussed with Dr. Thad Ranger from neurology.  She recommended an mri.  This was unremarkable and she felt as though no further workup was indicated.  She had returned to her baseline and seemed well.  She was discharged with the instructions to continue her plavix and follow up with her neurologist and cardiologist in the next week.   Geoffery Lyons, MD 06/20/12 [phone]

## 2012-06-20 NOTE — Telephone Encounter (Signed)
Dr Excell Seltzer reviewed and recommended that the pt see her PCP for symptoms.

## 2012-06-22 ENCOUNTER — Ambulatory Visit: Payer: 59 | Admitting: Oncology

## 2012-06-22 ENCOUNTER — Other Ambulatory Visit: Payer: 59 | Admitting: Lab

## 2012-10-02 ENCOUNTER — Ambulatory Visit: Payer: 59 | Admitting: Obstetrics and Gynecology

## 2012-10-05 ENCOUNTER — Encounter: Payer: 59 | Admitting: Obstetrics and Gynecology

## 2012-11-09 ENCOUNTER — Telehealth: Payer: Self-pay | Admitting: Cardiovascular Disease

## 2012-11-09 NOTE — Telephone Encounter (Signed)
Pt has Not picked up FMLA, Placed In Mail to Be sent to  Pt s Home address  11/09/12/KM

## 2012-11-16 ENCOUNTER — Ambulatory Visit (INDEPENDENT_AMBULATORY_CARE_PROVIDER_SITE_OTHER): Payer: 59 | Admitting: Cardiovascular Disease

## 2012-11-16 ENCOUNTER — Encounter: Payer: Self-pay | Admitting: Cardiovascular Disease

## 2012-11-16 VITALS — BP 108/76 | HR 70 | Ht 65.0 in | Wt 172.0 lb

## 2012-11-16 DIAGNOSIS — Q2111 Secundum atrial septal defect: Secondary | ICD-10-CM

## 2012-11-16 DIAGNOSIS — Q211 Atrial septal defect: Secondary | ICD-10-CM

## 2012-11-16 LAB — CBC WITH DIFFERENTIAL/PLATELET
HCT: 24.5 % — ABNORMAL LOW (ref 36.0–46.0)
Hemoglobin: 7.5 g/dL — CL (ref 12.0–15.0)
WBC: 5.8 10*3/uL (ref 4.5–10.5)

## 2012-11-16 NOTE — Patient Instructions (Addendum)
Your physician recommends that you have lab work today: CBC  Your physician has requested that you have an echocardiogram in [place]. Echocardiography is a painless test that uses sound waves to create images of your heart. It provides your doctor with information about the size and shape of your heart and how well your heart's chambers and valves are working. This procedure takes approximately one hour. There are no restrictions for this procedure.  Your physician wants you to follow-up in: 1 YEAR with Dr Excell Seltzer. You will receive a reminder letter in the mail two months in advance. If you don't receive a letter, please call our office to schedule the follow-up appointment.

## 2012-11-18 ENCOUNTER — Encounter: Payer: Self-pay | Admitting: Cardiovascular Disease

## 2012-11-18 NOTE — Progress Notes (Signed)
   HPI:  42 year-old woman presenting for follow-up evaluation. She has a hx of PFO and cryptogenic stroke. She underwent transcatheter PFO closure in July 2013 without complications. A gore helex device was utilized. She is off of all antiplatelet Rx and has had no symptoms of recurrent stroke or TIA. She has rare headaches and otherwise feels well. No chest pain, dyspnea, palpitations, or edema.   Outpatient Encounter Prescriptions as of 11/16/2012  Medication Sig Dispense Refill  . Multiple Vitamin (MULTIVITAMIN) tablet Take 1 tablet by mouth as needed.      . [DISCONTINUED] clopidogrel (PLAVIX) 75 MG tablet Take 75 mg by mouth every evening.       No facility-administered encounter medications on file as of 11/16/2012.    Allergies  Allergen Reactions  . Aspirin Anaphylaxis  . Lisinopril Anaphylaxis and Hives  . Penicillins Itching and Swelling    Past Medical History  Diagnosis Date  . History of TIAs     09/2009 and 02/2011; no residual deficit  . 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 PFO (patent foramen ovale)   . Mitral regurgitation     mild  . Migraine headache   . Mild tricuspid regurgitation   . Asthma   . Chicken pox   . Allergy     hay fever  . Microcytic anemia 10/18/2011  . Dysmenorrhea   . Iron deficiency anemia due to chronic blood loss   . Shortness of breath   . Refusal of blood transfusions as patient is Jehovah's Witness     ROS: Negative except as per HPI  BP 108/76  Pulse 70  Ht 5\' 5"  (1.651 m)  Wt 78.019 kg (172 lb)  BMI 28.62 kg/m2  SpO2 99%  PHYSICAL EXAM: Pt is alert and oriented, NAD HEENT: normal Neck: JVP - normal, carotids 2+= without bruits Lungs: CTA bilaterally CV: RRR without murmur or gallop Abd: soft, NT, Positive BS, no hepatomegaly Ext: no C/C/E, distal pulses intact and equal Skin: warm/dry no rash  ASSESSMENT AND PLAN: 1. PFO s/p transcatheter closure. No residual problems. Repeat  echo in July for 1 year follow-up.  2. Anemia, Fe-deficient. Longstanding problem with no recent labs. Repeated today and Hgb/Hct 7.5/24.5. Needs to get back on iron replacement and see hematology. She appears asymptomatic.  [name] 11/18/2012 10:22 PM

## 2013-01-28 ENCOUNTER — Other Ambulatory Visit (HOSPITAL_COMMUNITY): Payer: 59

## 2013-01-28 ENCOUNTER — Ambulatory Visit (HOSPITAL_COMMUNITY): Payer: 59 | Attending: Cardiovascular Disease

## 2013-01-28 DIAGNOSIS — R0609 Other forms of dyspnea: Secondary | ICD-10-CM | POA: Insufficient documentation

## 2013-01-28 DIAGNOSIS — Q211 Atrial septal defect: Secondary | ICD-10-CM | POA: Insufficient documentation

## 2013-01-28 DIAGNOSIS — I079 Rheumatic tricuspid valve disease, unspecified: Secondary | ICD-10-CM | POA: Insufficient documentation

## 2013-01-28 DIAGNOSIS — Z8673 Personal history of transient ischemic attack (TIA), and cerebral infarction without residual deficits: Secondary | ICD-10-CM | POA: Insufficient documentation

## 2013-01-28 DIAGNOSIS — Q2111 Secundum atrial septal defect: Secondary | ICD-10-CM | POA: Insufficient documentation

## 2013-01-28 DIAGNOSIS — R0989 Other specified symptoms and signs involving the circulatory and respiratory systems: Secondary | ICD-10-CM | POA: Insufficient documentation

## 2013-01-28 NOTE — Progress Notes (Signed)
Echocardiogram performed.  

## 2013-05-10 IMAGING — CT CT HEAD W/O CM
1 series · 16 of 30 positions shown, 20 images · non-contrast
Comparison: MRI 01/24/2012.

CLINICAL DATA: Weakness, numbness, dizziness.

CT HEAD WITHOUT CONTRAST
TECHNIQUE: Contiguous axial images were obtained from the base of
the skull through the vertex without contrast.

[Series 2: head routine 4.8 h37s · axial · 0.43mm/px · z∈[+1104,+1239]mm · 16 of 30 slices shown, 20 images]
[im 2/30  brain]
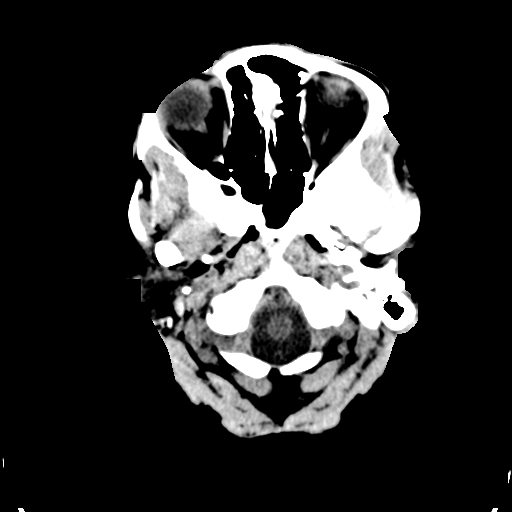
[im 2/30  bone]
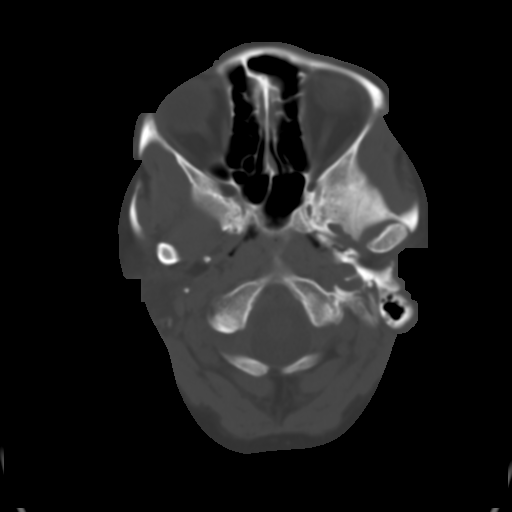
[im 4/30  brain]
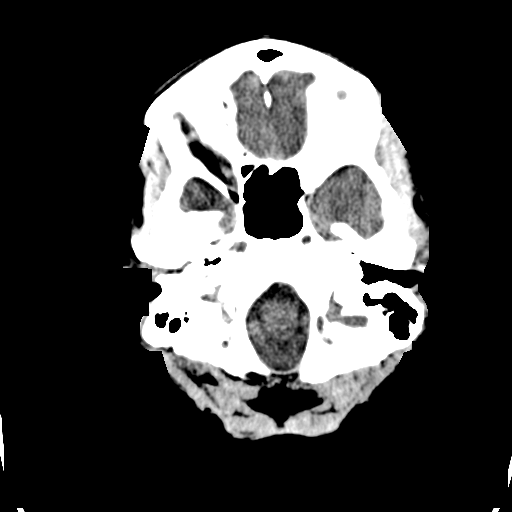
[im 6/30  brain]
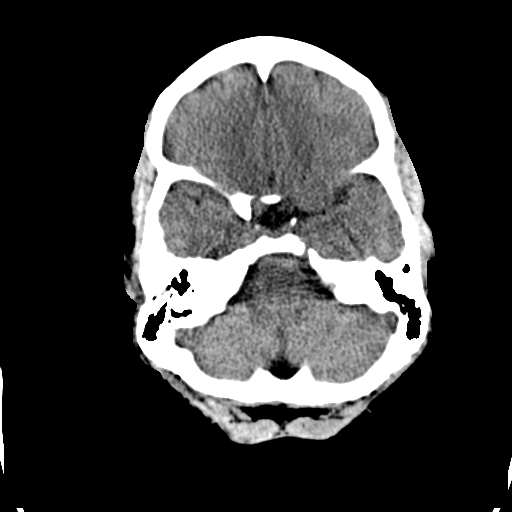
[im 8/30  brain]
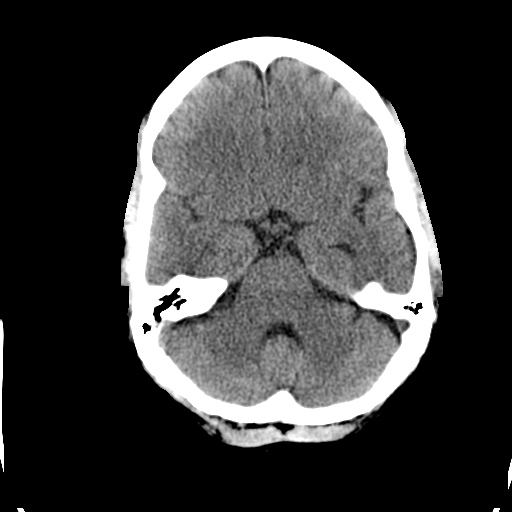
[im 9/30  brain]
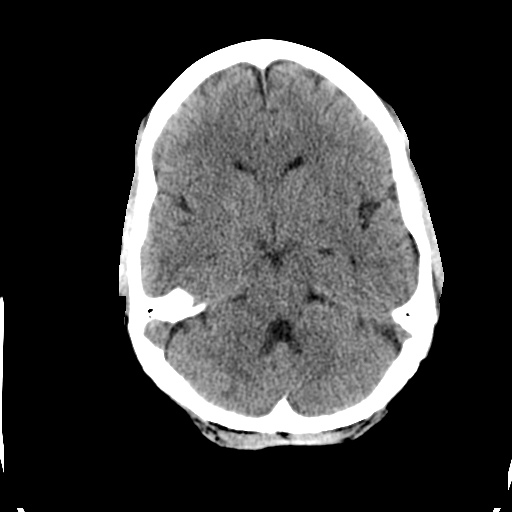
[im 9/30  bone]
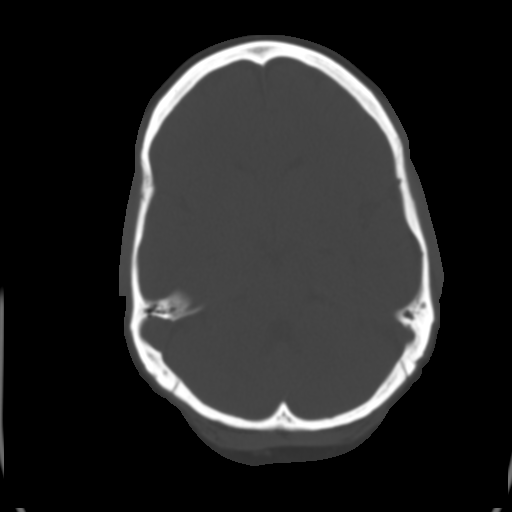
[im 11/30  brain]
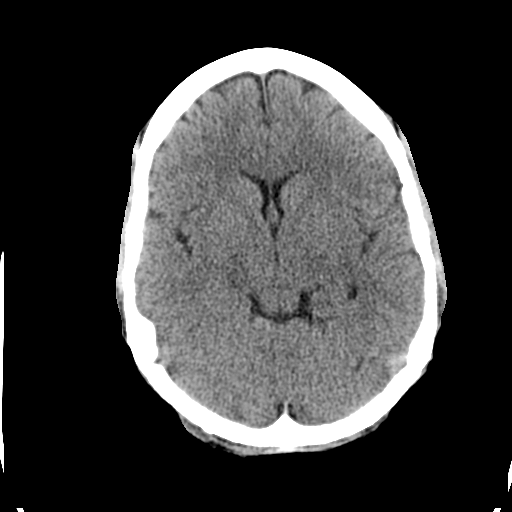
[im 13/30  brain]
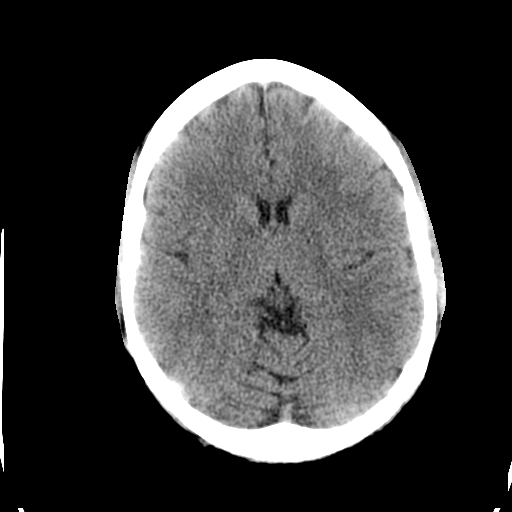
[im 15/30  brain]
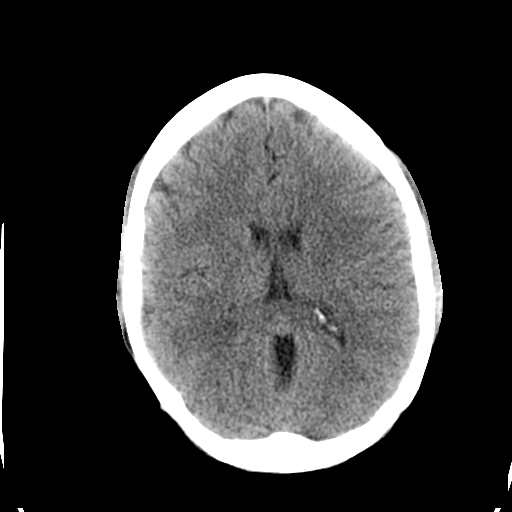
[im 16/30  brain]
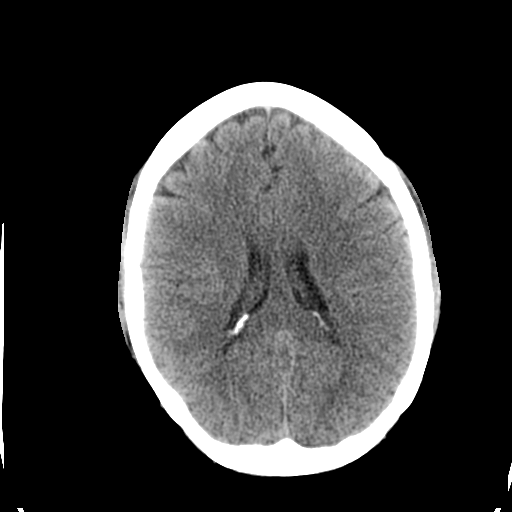
[im 16/30  bone]
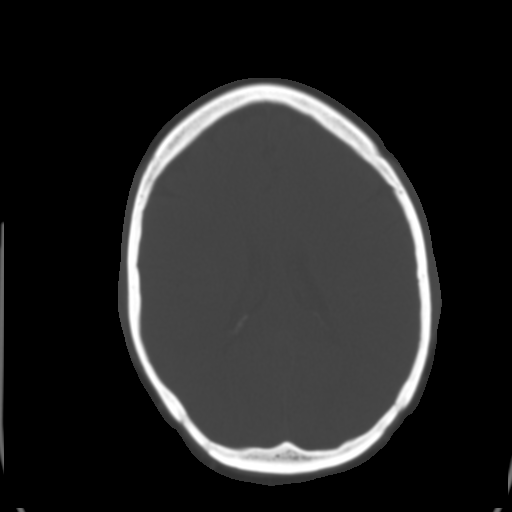
[im 18/30  brain]
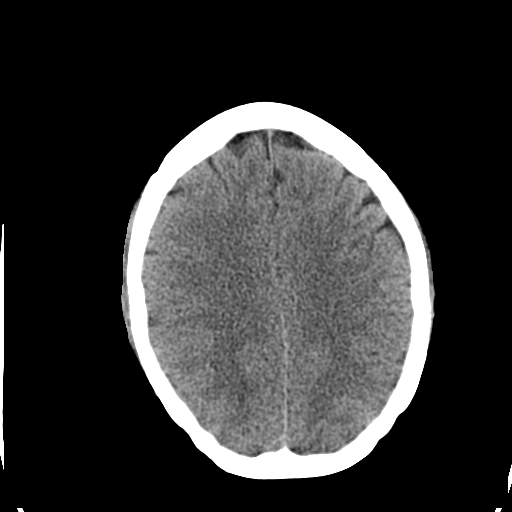
[im 20/30  brain]
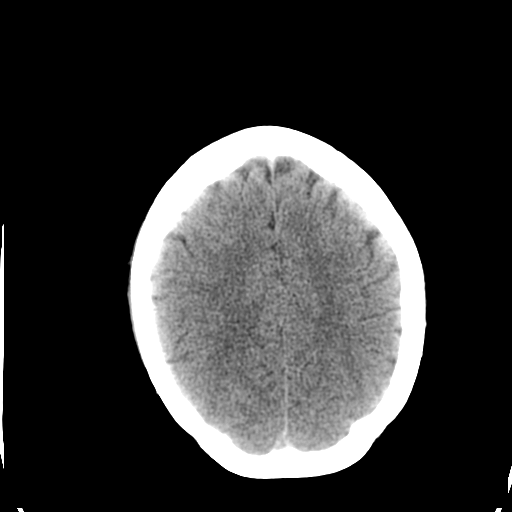
[im 22/30  brain]
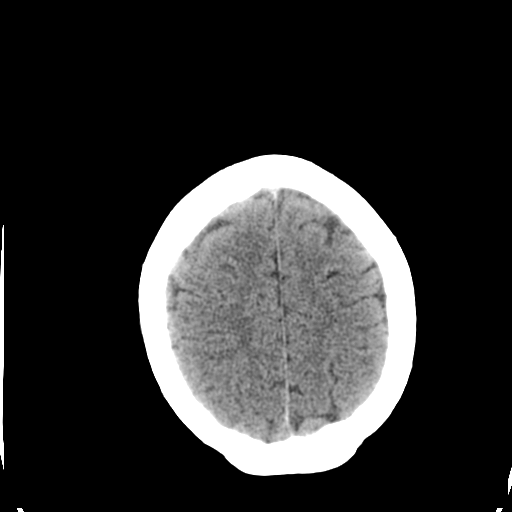
[im 23/30  brain]
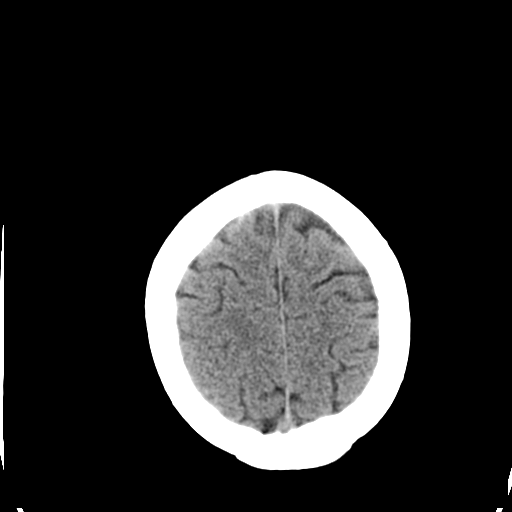
[im 23/30  bone]
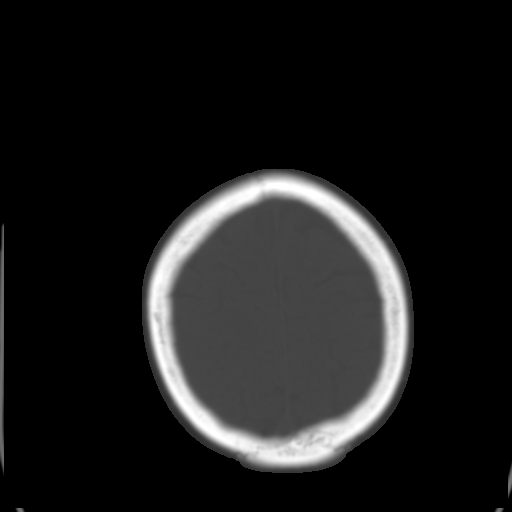
[im 25/30  brain]
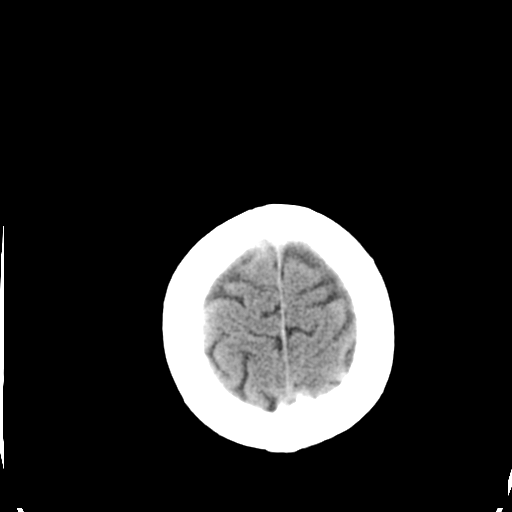
[im 27/30  brain]
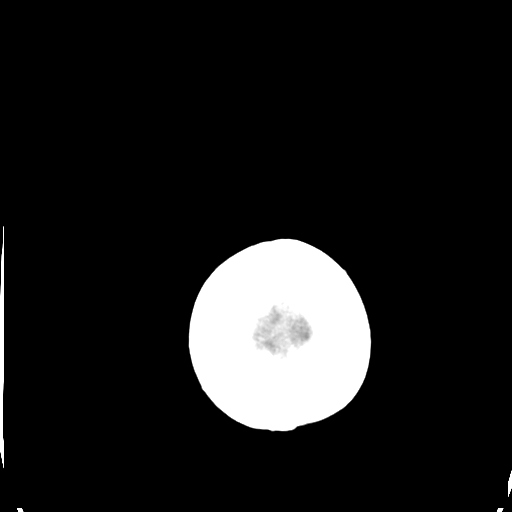
[im 29/30  brain]
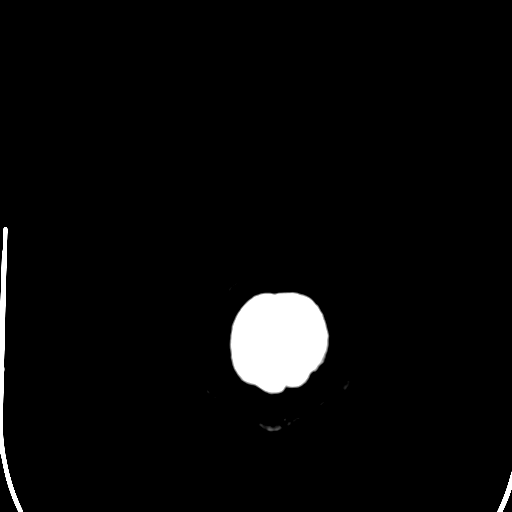

[16 of 30 positions shown; findings below may reference images not displayed]

FINDINGS: No acute intracranial abnormality.  Specifically, no
hemorrhage, hydrocephalus, mass lesion, acute infarction, or
significant intracranial injury.  No acute calvarial abnormality.
Visualized paranasal sinuses and mastoids clear.  Orbital soft
tissues unremarkable.
IMPRESSION: No acute intracranial abnormality.

## 2013-06-25 ENCOUNTER — Encounter: Payer: Self-pay | Admitting: Podiatry

## 2013-06-25 ENCOUNTER — Ambulatory Visit (INDEPENDENT_AMBULATORY_CARE_PROVIDER_SITE_OTHER): Payer: 59 | Admitting: Podiatry

## 2013-06-25 ENCOUNTER — Ambulatory Visit (INDEPENDENT_AMBULATORY_CARE_PROVIDER_SITE_OTHER): Payer: 59

## 2013-06-25 VITALS — BP 124/74 | HR 90 | Resp 16 | Ht 65.0 in | Wt 175.0 lb

## 2013-06-25 DIAGNOSIS — M79671 Pain in right foot: Secondary | ICD-10-CM

## 2013-06-25 DIAGNOSIS — M79609 Pain in unspecified limb: Secondary | ICD-10-CM

## 2013-06-25 DIAGNOSIS — M2140 Flat foot [pes planus] (acquired), unspecified foot: Secondary | ICD-10-CM

## 2013-06-25 DIAGNOSIS — M2010 Hallux valgus (acquired), unspecified foot: Secondary | ICD-10-CM

## 2013-06-25 NOTE — Progress Notes (Signed)
   Subjective:    Patient ID: Morgan Shaffer, female    DOB: 13-Jan-1971, 42 y.o.   MRN: [id number]  HPI Comments: N joint pain , throbs, sharp , burns  L left foot bunion  D last winter  O on /off  C worse  A wakes up at night T no treatment      Review of Systems  HENT:       Ringing in ears   Musculoskeletal:       Joint pain  All other systems reviewed and are negative.       Objective:   Physical Exam        Assessment & Plan:

## 2013-06-25 NOTE — Progress Notes (Signed)
Morgan Shaffer presents today as a 42 year old black female with a chief complaint of pain to the first metatarsophalangeal joint of the left foot. She states this is been one month since last winter and it seems to be getting worse with sharp stabbing pains they limit her activities and limit her shoe gear. She denies trauma to the foot and states that her feet residual her grandmothers feet. She goes on to say that her grandmother had destructive arthritis.  Objective: I have reviewed her past medical history medications and allergies as well as her review of systems. Vital signs are stable she is alert and oriented x3. Pulses are strongly palpable bilateral capillary fill time to digits one through 5 of the bilateral foot is immediate. Neurologic sensorium is intact per Semmes-Weinstein monofilament. Deep tendon reflexes are intact +5 over 5 dorsiflexors plantar flexors inverters and everters all intrinsic musculature is intact. Orthopedic evaluation demonstrates pes planus flexible in nature bilateral foot. All joints distal to the ankle a full range of motion with the exception of the first metatarsophalangeal joint left greater than right she has hallux abductovalgus deformity with mild elevated worse metatarsal resulting in a hallux limitus her first metatarsal and first intermetatarsal space is increased greater than normal values. Her hallux abductus angle is increased with a valgus rotation. Radiographic evaluation demonstrates pes planus with hallux abductovalgus deformity and early osteoarthritic changes first metatarsophalangeal joint left greater than right. She does have crepitation on range of motion of the first metatarsophalangeal joint left.  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 Pes planus bilateral.  Plan: We discussed the etiology pathology conservative versus surgical therapies secondary  to failure of conservative therapies consisting of wider shoe gear ice therapy nonsteroidals surgical consideration is indicated. We considered her today for an Morgan Shaffer -type osteotomy with screw. I answered all the questions regarding his procedures to the best my ability in layman's terms she understood it was amenable to it and signed Dr. pages of the consent form. She was dispensed a Cam Walker today as well as paperwork for the surgery center. I will followup with her in the near future for surgical intervention.

## 2013-06-25 NOTE — Patient Instructions (Addendum)
[facility] A bunion is a boney protrusion at the base of your big toe (metatarsal-phalangeal joint). This problem, if painful or troublesome can be corrected with surgery. This is an elective surgery, so you can pick a convenient time for the procedure. The surgery may:  Improve appearance (cosmetic).  Relieve pain.  Improve function. Your foot is made up of a complex set of twenty-six bones which are held together by tough fibrous ligaments. The movement of the foot is controlled by muscles in the foot and leg. These muscles attach to the foot by cord like structures (tendons) that attach muscle to bone. If surgery is recommended, your caregiver will explain your foot problem and how surgery can improve it. Your caregiver can answer questions you may have about the potential risks and complications involved. After determining that foot surgery is necessary to correct your problem, you can proceed with plans for the surgery. LET YOUR CAREGIVER KNOW ABOUT:   Previous problems with anesthetics or medicines used to numb the skin.  Allergies to dyes, iodine, foods, and/or latex.  Medicines taken including herbs, eye drops, prescription medicines (especially medicines used to "thin the blood"), aspirin and other over-the-counter medicines, and steroids (by mouth or as a cream).  History of bleeding or blood problems.  Possibility of pregnancy, if this applies.  History of blood clots in your legs and/or lungs.  Previous surgery.  Other important health problems. Let your caregiver know about health changes prior to surgery. BEFORE THE PROCEDURE  You should be present 60 minutes prior to your procedure or as directed.  PROCEDURE BUNION TYPES AND THEIR TREATMENTS  Positional Bunion. A positional bunion develops when a bony growth on the side of the metatarsal bone enlarges the joint. The metatarsal forces the joint capsule to stretch over it. As this growth pushes the big toe toward the  others, the tendons on the inside tighten. This forces the big toe farther out of alignment. The bunion presses against the shoe, irritating the skin and causes further pain and disability.  Positional Bunionectomy Treatment. The bunion is removed. Tight tendons may be released.  Follow-up Care. Your toe is apt to be stiff at first but will loosen up as you move it. You may need to wear a special surgical shoe and, possibly, a splint for about three weeks.  Mild Structural Bunion. Structural bunions occur when the angle between the first and second metatarsal bones increases to a point where it is greater than normal. The increased angle of the metatarsals makes the big toe slant toward the other toes. Sometimes bony growths may form. Irritation and swelling often follow.  Structural Bunionectomy Treatment. Your caregiver surgically repositions the bone by decreasing the angle and may use a fixation device to hold it together. The bunion (bump) is also removed.  Degenerative Joint Disease (Arthritis). When wear-and-tear arthritis (osteoarthritis) of aging affects the big toe joint, pain and reduced joint motion may result. This is not a true bunion but may be associated with bunions. Left untreated, it can increase wear and tear in the joint and break down the cartilage. Pain and stiffness are problems of both wear-and-tear arthritis and rheumatoid arthritis.  Arthroplasty With Joint Implantation As Treatment. The bunion is first removed; then the degenerated joint is removed and replaced with an implant. AFTER THE PROCEDURE  After surgery your bone will heal in phases. A callous (new bone formation) forms, bridging the damaged bone allowing it to heal. In about 6 months, the  bone is back to normal strength along with a return of nearly normal function. It is best to do elective surgeries when your health is optimal.  After surgery, you will be taken to the recovery area where a nurse will watch and  check your progress. Once you are awake, stable, and taking fluids well, barring other problems you will be allowed to go home. HOME CARE INSTRUCTIONS  Be sure to ask your caregiver how long you will be off your feet and home from work. Plan accordingly. There are several types of bunions and varying surgical treatments for each. Common types are explained above. Your surgery may be similar and may include a fixation device (such as a small screw). Your foot and ankle may be immobilized by a cast (from your toes to below your knee). You may be asked not to bear weight on this foot for a few weeks or until comfortable. Once home, an ice pack applied to your operative site may help with discomfort and keep the swelling down. You may be able to walk a day or two after surgery. Your podiatrist may prescribe a splint or a special shoe to be worn for several weeks. Only take over-the-counter or prescription medicines for pain, discomfort, or fever as directed by your caregiver.  SEEK MEDICAL CARE IF:   There is increased bleeding (more than a small spot) from the surgical site.  You notice redness, swelling, or increasing pain in the surgical site.  Pus is coming from the site.  An unexplained oral temperature above 102 F (38.9 C) develops.  You notice a foul smell coming from the surgical site or dressing. SEEK IMMEDIATE MEDICAL CARE IF:  You develop a rash, have difficulty breathing, or have any allergic problems with medications. Document Released: 07/08/2000 Document Revised: 10/03/2011 Document Reviewed: 07/14/2008 [facility] Patient Information 2014 [place], [place].     Pre-Operative Instructions  Congratulations, you have decided to take an important step to improving your quality of life.  You can be assured that the doctors of [facility] will be with you every step of the way.  1. Plan to be at the surgery center/hospital at least 1 (one) hour prior to your scheduled time unless  otherwise directed by the surgical center/hospital staff.  You must have a responsible adult accompany you, remain during the surgery and drive you home.  Make sure you have directions to the surgical center/hospital and know how to get there on time. 2. For hospital based surgery you will need to obtain a history and physical form from your family physician within 1 month prior to the date of surgery- we will give you a form for you primary physician.  3. We make every effort to accommodate the date you request for surgery.  There are however, times where surgery dates or times have to be moved.  We will contact you as soon as possible if a change in schedule is required.   4. No Aspirin/Ibuprofen for one week before surgery.  If you are on aspirin, any non-steroidal anti-inflammatory medications (Mobic, Aleve, Ibuprofen) you should stop taking it 7 days prior to your surgery.  You make take Tylenol  For pain prior to surgery.  5. Medications- If you are taking daily heart and blood pressure medications, seizure, reflux, allergy, asthma, anxiety, pain or diabetes medications, make sure the surgery center/hospital is aware before the day of surgery so they may notify you which medications to take or avoid the day of  surgery. 6. No food or drink after midnight the night before surgery unless directed otherwise by surgical center/hospital staff. 7. No alcoholic beverages 24 hours prior to surgery.  No smoking 24 hours prior to or 24 hours after surgery. 8. Wear loose pants or shorts- loose enough to fit over bandages, boots, and casts. 9. No slip on shoes, sneakers are best. 10. Bring your boot with you to the surgery center/hospital.  Also bring crutches or a walker if your physician has prescribed it for you.  If you do not have this equipment, it will be provided for you after surgery. 11. If you have not been contracted by the surgery center/hospital by the day before your surgery, call to confirm the  date and time of your surgery. 12. Leave-time from work may vary depending on the type of surgery you have.  Appropriate arrangements should be made prior to surgery with your employer. 13. Prescriptions will be provided immediately following surgery by your doctor.  Have these filled as soon as possible after surgery and take the medication as directed. 14. Remove nail polish on the operative foot. 15. Wash the night before surgery.  The night before surgery wash the foot and leg well with the antibacterial soap provided and water paying special attention to beneath the toenails and in between the toes.  Rinse thoroughly with water and dry well with a towel.  Perform this wash unless told not to do so by your physician.  Enclosed: 1 Ice pack (please put in freezer the night before surgery)   1 Hibiclens skin cleaner   Pre-op Instructions  If you have any questions regarding the instructions, do not hesitate to call our office.  [place]: [address] [place], [place] [address] [phone]  [place]: [address], [place], [place] [address] [phone]  [place]: [address], [place] [address] [phone]  Dr. Santiago Bumpers DPM, Dr. Cristie Hem DPM Dr. Alvan Dame DPM, Dr. Ardelle Anton DPM, Dr. Marlowe Aschoff DPM

## 2013-07-16 ENCOUNTER — Encounter: Payer: Self-pay | Admitting: Family

## 2013-07-16 ENCOUNTER — Ambulatory Visit (INDEPENDENT_AMBULATORY_CARE_PROVIDER_SITE_OTHER): Payer: 59 | Admitting: Family

## 2013-07-16 ENCOUNTER — Telehealth: Payer: Self-pay | Admitting: *Deleted

## 2013-07-16 VITALS — BP 130/78 | HR 88 | Temp 98.3°F | Resp 16 | Ht 65.5 in | Wt 169.1 lb

## 2013-07-16 DIAGNOSIS — B029 Zoster without complications: Secondary | ICD-10-CM

## 2013-07-16 MED ORDER — VALACYCLOVIR HCL 1 G PO TABS: 1000.0000 mg | ORAL_TABLET | Freq: Three times a day (TID) | ORAL | Status: DC

## 2013-07-16 MED ORDER — TRAMADOL HCL 50 MG PO TABS: 50.0000 mg | ORAL_TABLET | Freq: Three times a day (TID) | ORAL | Status: DC | PRN

## 2013-07-16 NOTE — Progress Notes (Signed)
Pre visit review using our clinic review tool, if applicable. No additional management support is needed unless otherwise documented below in the visit note. 

## 2013-07-16 NOTE — Assessment & Plan Note (Signed)
Rx with valtrex, add tramadol prn pain. Follow up if symptoms worsen or if symptoms not improved in 1 week.  Advised not to drive after taking tramadol.

## 2013-07-16 NOTE — Patient Instructions (Signed)
Shingles Shingles (herpes zoster) is an infection that is caused by the same virus that causes chickenpox (varicella). The infection causes a painful skin rash and fluid-filled blisters, which eventually break open, crust over, and heal. It may occur in any area of the body, but it usually affects only one side of the body or face. The pain of shingles usually lasts about 1 month. However, some people with shingles may develop long-term (chronic) pain in the affected area of the body. Shingles often occurs many years after the person had chickenpox. It is more common:  In people older than 50 years.  In people with weakened immune systems, such as those with HIV, AIDS, or cancer.  In people taking medicines that weaken the immune system, such as transplant medicines.  In people under great stress. CAUSES  Shingles is caused by the varicella zoster virus (VZV), which also causes chickenpox. After a person is infected with the virus, it can remain in the person's body for years in an inactive state (dormant). To cause shingles, the virus reactivates and breaks out as an infection in a nerve root. The virus can be spread from person to person (contagious) through contact with open blisters of the shingles rash. It will only spread to people who have not had chickenpox. When these people are exposed to the virus, they may develop chickenpox. They will not develop shingles. Once the blisters scab over, the person is no longer contagious and cannot spread the virus to others. SYMPTOMS  Shingles shows up in stages. The initial symptoms may be pain, itching, and tingling in an area of the skin. This pain is usually described as burning, stabbing, or throbbing.In a few days or weeks, a painful red rash will appear in the area where the pain, itching, and tingling were felt. The rash is usually on one side of the body in a band or belt-like pattern. Then, the rash usually turns into fluid-filled blisters. They  will scab over and dry up in approximately 2 3 weeks. Flu-like symptoms may also occur with the initial symptoms, the rash, or the blisters. These may include:  Fever.  Chills.  Headache.  Upset stomach. DIAGNOSIS  Your caregiver will perform a skin exam to diagnose shingles. Skin scrapings or fluid samples may also be taken from the blisters. This sample will be examined under a microscope or sent to a lab for further testing. TREATMENT  There is no specific cure for shingles. Your caregiver will likely prescribe medicines to help you manage the pain, recover faster, and avoid long-term problems. This may include antiviral drugs, anti-inflammatory drugs, and pain medicines. HOME CARE INSTRUCTIONS   Take a cool bath or apply cool compresses to the area of the rash or blisters as directed. This may help with the pain and itching.   Only take over-the-counter or prescription medicines as directed by your caregiver.   Rest as directed by your caregiver.  Keep your rash and blisters clean with mild soap and cool water or as directed by your caregiver.  Do not pick your blisters or scratch your rash. Apply an anti-itch cream or numbing creams to the affected area as directed by your caregiver.  Keep your shingles rash covered with a loose bandage (dressing).  Avoid skin contact with:  Babies.   Pregnant women.   Children with eczema.   Elderly people with transplants.   People with chronic illnesses, such as leukemia or AIDS.   Wear loose-fitting clothing to help ease   the pain of material rubbing against the rash.  Keep all follow-up appointments with your caregiver.If the area involved is on your face, you may receive a referral for follow-up to a specialist, such as an eye doctor (ophthalmologist) or an ear, nose, and throat (ENT) doctor. Keeping all follow-up appointments will help you avoid eye complications, chronic pain, or disability.  SEEK IMMEDIATE MEDICAL  CARE IF:   You have facial pain, pain around the eye area, or loss of feeling on one side of your face.  You have ear pain or ringing in your ear.  You have loss of taste.  Your pain is not relieved with prescribed medicines.   Your redness or swelling spreads.   You have more pain and swelling.  Your condition is worsening or has changed.   You have a feveror persistent symptoms for more than 2 3 days.  You have a fever and your symptoms suddenly get worse. MAKE SURE YOU:  Understand these instructions.  Will watch your condition.  Will get help right away if you are not doing well or get worse. Document Released: 07/11/2005 Document Revised: 04/04/2012 Document Reviewed: 02/23/2012 [employer] Patient Information 2014 [employer].  

## 2013-07-16 NOTE — Progress Notes (Signed)
   Subjective:    Patient ID: Morgan Shaffer, female    DOB: 1970-08-26, 42 y.o.   MRN: [id number]  HPI  Morgan Shaffer is a 42 yr old female who presents today with chief complaint of rash. Rash is located on the left torso. Rash is associated with pain/itching. Rash has been present x 72  hours.     Review of Systems See HPI Past Medical History  Diagnosis Date  . History of TIAs     09/2009 and 02/2011; no residual deficit  . 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 PFO (patent foramen ovale)   . Mitral regurgitation     mild  . Migraine headache   . Mild tricuspid regurgitation   . Asthma   . Chicken pox   . Allergy     hay fever  . Microcytic anemia 10/18/2011  . Dysmenorrhea   . Iron deficiency anemia due to chronic blood loss   . Shortness of breath   . Refusal of blood transfusions as patient is Jehovah's Witness     History   Social History  . Marital Status: Single    Spouse Name: N/A    Number of Children: 2  . Years of Education: N/A   Occupational History  .        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  [employer]   Social History Main Topics  . Smoking status: Never Smoker   . Smokeless tobacco: Never Used  . Alcohol Use: Yes     Comment: ocassional  . Drug Use: No  . Sexual Activity: No   Other Topics Concern  . Not on file   Social History Narrative  . No narrative on file    Past Surgical History  Procedure Laterality Date  . Patent foramen ovale closure  02/02/12    Family History  Problem Relation Age of Onset  . Hypertension      positive family hx of  . Alcohol abuse    . Arrhythmia Mother   . Arthritis Maternal Grandmother   . Alcohol abuse Maternal Grandfather   . Alcohol abuse Paternal Grandmother   . Cancer Maternal Uncle     Prostate Cancer  . Cancer Maternal Uncle     Prostate Cancer    Allergies  Allergen Reactions  . Aspirin Anaphylaxis  . Lisinopril Anaphylaxis and Hives  .  Penicillins Itching and Swelling    Current Outpatient Prescriptions on File Prior to Visit  Medication Sig Dispense Refill  . Multiple Vitamin (MULTIVITAMIN) tablet Take 1 tablet by mouth as needed.       No current facility-administered medications on file prior to visit.    BP 130/78  Pulse 88  Temp(Src) 98.3 F (36.8 C) (Oral)  Resp 16  Ht 5' 5.5" (1.664 m)  Wt 169 lb 1.9 oz (76.712 kg)  BMI 27.70 kg/m2  SpO2 99%       Objective:   Physical Exam  Constitutional: She appears well-developed and well-nourished. No distress.  Cardiovascular: Normal rate and regular rhythm.   No murmur heard. Pulmonary/Chest: Effort normal and breath sounds normal. No respiratory distress. She has no wheezes. She has no rales. She exhibits no tenderness.  Skin:     Blistered linear rash supperimposed on erythematous rash          Assessment & Plan:

## 2013-07-16 NOTE — Telephone Encounter (Signed)
Pt asked if her paperwork for the surgery of 08/02/2012, has been received and completed and refaxed.  I will advise Algis Greenhouse.

## 2013-07-17 ENCOUNTER — Telehealth: Payer: Self-pay | Admitting: *Deleted

## 2013-07-17 NOTE — Telephone Encounter (Addendum)
Pt states she has shingles.  How will that affect her surgery on 08/02/2013?  I will advise Dr Al Corpus and call pt.  07/23/2013, left message stating Dr Geryl Rankins recommendations.

## 2013-07-17 NOTE — Telephone Encounter (Signed)
Entered in error

## 2013-07-22 NOTE — Telephone Encounter (Signed)
Typically as long as she is not actively blistering and in severe pain. She should be fine.

## 2013-07-23 ENCOUNTER — Telehealth: Payer: Self-pay | Admitting: *Deleted

## 2013-07-23 NOTE — Telephone Encounter (Signed)
Pt called states she received my message from DR [facility] concerning the shingles.  Pt states her shingles are very painful and still active blistering.  Pt states she would like to reschedule 08/02/2013 surgery.  I called to reschedule, left a message stating I would cancel the 08/02/2013 surgery and gave her the dates of the next 4 Fridays available for surgery with Dr Al Corpus, to reschedule for her convenience.

## 2013-07-24 NOTE — Telephone Encounter (Signed)
Good I would prefer for her to wait since she still has active blistering.

## 2013-08-08 ENCOUNTER — Encounter: Payer: 59 | Admitting: Podiatry

## 2013-10-11 ENCOUNTER — Encounter: Payer: Self-pay | Admitting: Cardiovascular Disease

## 2013-10-11 ENCOUNTER — Ambulatory Visit (INDEPENDENT_AMBULATORY_CARE_PROVIDER_SITE_OTHER): Payer: 59 | Admitting: Cardiovascular Disease

## 2013-10-11 VITALS — BP 137/78 | HR 57 | Ht 65.0 in | Wt 167.0 lb

## 2013-10-11 DIAGNOSIS — Q2112 Patent foramen ovale: Secondary | ICD-10-CM

## 2013-10-11 DIAGNOSIS — Q2111 Secundum atrial septal defect: Secondary | ICD-10-CM

## 2013-10-11 DIAGNOSIS — Q211 Atrial septal defect: Secondary | ICD-10-CM

## 2013-10-11 LAB — CBC WITH DIFFERENTIAL/PLATELET
BASOS PCT: 0.6 % (ref 0.0–3.0)
Basophils Absolute: 0 10*3/uL (ref 0.0–0.1)
EOS PCT: 2.5 % (ref 0.0–5.0)
Eosinophils Absolute: 0.1 10*3/uL (ref 0.0–0.7)
HCT: 39.4 % (ref 36.0–46.0)
Hemoglobin: 12.4 g/dL (ref 12.0–15.0)
LYMPHS PCT: 35.9 % (ref 12.0–46.0)
Lymphs Abs: 1.2 10*3/uL (ref 0.7–4.0)
MCHC: 31.5 g/dL (ref 30.0–36.0)
MCV: 80.8 fl (ref 78.0–100.0)
MONO ABS: 0.2 10*3/uL (ref 0.1–1.0)
Monocytes Relative: 7 % (ref 3.0–12.0)
NEUTROS PCT: 54 % (ref 43.0–77.0)
Neutro Abs: 1.8 10*3/uL (ref 1.4–7.7)
PLATELETS: 290 10*3/uL (ref 150.0–400.0)
RBC: 4.88 Mil/uL (ref 3.87–5.11)
RDW: 18.9 % — ABNORMAL HIGH (ref 11.5–14.6)
WBC: 3.3 10*3/uL — AB (ref 4.5–10.5)

## 2013-10-11 NOTE — Patient Instructions (Signed)
Your physician recommends that you have lab work today:  CBC  Your physician wants you to follow-up in: 1 year with Dr. Burt Knack after your echocardiogram.  You will receive a reminder letter in the mail two months in advance. If you don't receive a letter, please call our office to schedule the follow-up appointment.  Your physician has requested that you have an echocardiogram in 1 year before you see Dr. Burt Knack. Echocardiography is a painless test that uses sound waves to create images of your heart. It provides your doctor with information about the size and shape of your heart and how well your heart's chambers and valves are working. This procedure takes approximately one hour. There are no restrictions for this procedure.

## 2013-10-11 NOTE — Progress Notes (Signed)
    HPI:  43 year-old woman presenting for follow-up evaluation. She has a hx of PFO and cryptogenic stroke. She underwent transcatheter PFO closure in July 2440 without complications. A gore helex device was utilized. She is off of all antiplatelet Rx and has had no symptoms of recurrent stroke or TIA. The patient has chronic iron deficiency anemia with most recent hemoglobin 7.5.  The patient is doing well. She denies chest pain, palpitations, shortness of breath, or leg swelling. She's been taking her iron regularly. She has not followed up at the cancer center because she had a lot of problems with intravenous iron.   Outpatient Encounter Prescriptions as of 10/11/2013  Medication Sig  . Multiple Vitamin (MULTIVITAMIN) tablet Take 1 tablet by mouth as needed.  . [DISCONTINUED] traMADol (ULTRAM) 50 MG tablet Take 1 tablet (50 mg total) by mouth every 8 (eight) hours as needed.  . [DISCONTINUED] valACYclovir (VALTREX) 1000 MG tablet Take 1 tablet (1,000 mg total) by mouth 3 (three) times daily.    Allergies  Allergen Reactions  . Aspirin Anaphylaxis  . Lisinopril Anaphylaxis and Hives  . Penicillins Itching and Swelling    Past Medical History  Diagnosis Date  . History of TIAs     09/2009 and 02/2011; no residual deficit  . 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 PFO (patent foramen ovale)   . Mitral regurgitation     mild  . Migraine headache   . Mild tricuspid regurgitation   . Asthma   . Chicken pox   . Allergy     hay fever  . Microcytic anemia 10/18/2011  . Dysmenorrhea   . Iron deficiency anemia due to chronic blood loss   . Shortness of breath   . Refusal of blood transfusions as patient is Jehovah's Witness    BP 137/78  Pulse 57  Ht 5\' 5"  (1.651 m)  Wt 167 lb (75.751 kg)  BMI 27.79 kg/m2  PHYSICAL EXAM: Pt is alert and oriented, NAD HEENT: normal Neck: JVP - normal, carotids 2+= without bruits Lungs: CTA  bilaterally CV: RRR without murmur or gallop Abd: soft, NT, Positive BS, no hepatomegaly Ext: no C/C/E, distal pulses intact and equal Skin: warm/dry no rash  ASSESSMENT AND PLAN: 1. PFO with history of cryptogenic stroke. The patient is status post transcatheter closure. Her last echocardiogram was in July 2014. She is doing well without complaints. I'm going to bring her back for followup in one year with an echocardiogram at that time.  2. Chronic iron deficiency anemia. Last hemoglobin was 7.5. This was nearly one year ago. Will repeat a CBC and refer her back to hematology if this remains extremely low. Hopefully her blood count is better. She certainly has no signs of hemodynamically significant anemia with a resting heart rate of 57 beats per minute and normal blood pressure.  Sherren Mocha 10/11/2013 10:45 AM

## 2013-10-16 ENCOUNTER — Telehealth: Payer: Self-pay | Admitting: Nurse Practitioner

## 2013-10-16 DIAGNOSIS — D72819 Decreased white blood cell count, unspecified: Secondary | ICD-10-CM

## 2013-10-16 NOTE — Telephone Encounter (Signed)
Message copied by Emmaline Life on Wed Oct 16, 2013  2:43 PM ------      Message from: Sherren Mocha      Created: Sun Oct 13, 2013 10:27 AM       HgB much better than in past now on oral iron replacement. Low WBC noted. Would repeat CBC in one month and if leukopenia persists probably should go back to hematology. ------

## 2013-10-16 NOTE — Telephone Encounter (Signed)
Reviewed results with patient who verbalized understanding agreement and is scheduled to repeat on 4/23. Order in epic

## 2013-11-14 ENCOUNTER — Other Ambulatory Visit: Payer: 59

## 2013-11-19 ENCOUNTER — Other Ambulatory Visit: Payer: Self-pay | Admitting: Obstetrics and Gynecology

## 2013-11-19 DIAGNOSIS — Z1231 Encounter for screening mammogram for malignant neoplasm of breast: Secondary | ICD-10-CM

## 2013-11-19 LAB — HM PAP SMEAR

## 2013-11-20 ENCOUNTER — Encounter (INDEPENDENT_AMBULATORY_CARE_PROVIDER_SITE_OTHER): Payer: Self-pay

## 2013-11-20 ENCOUNTER — Ambulatory Visit
Admission: RE | Admit: 2013-11-20 | Discharge: 2013-11-20 | Disposition: A | Discharge status: Home / Self Care | Payer: 59 | Source: Ambulatory Visit | Attending: Obstetrics and Gynecology | Admitting: Obstetrics and Gynecology

## 2013-11-20 DIAGNOSIS — Z1231 Encounter for screening mammogram for malignant neoplasm of breast: Secondary | ICD-10-CM

## 2013-11-21 ENCOUNTER — Other Ambulatory Visit: Payer: Self-pay | Admitting: Obstetrics and Gynecology

## 2013-11-21 DIAGNOSIS — R928 Other abnormal and inconclusive findings on diagnostic imaging of breast: Secondary | ICD-10-CM

## 2013-12-02 ENCOUNTER — Other Ambulatory Visit: Payer: Self-pay

## 2013-12-02 ENCOUNTER — Other Ambulatory Visit: Payer: Self-pay | Admitting: Obstetrics and Gynecology

## 2013-12-02 DIAGNOSIS — R928 Other abnormal and inconclusive findings on diagnostic imaging of breast: Secondary | ICD-10-CM

## 2013-12-04 ENCOUNTER — Ambulatory Visit
Admission: RE | Admit: 2013-12-04 | Discharge: 2013-12-04 | Disposition: A | Discharge status: Home / Self Care | Payer: 59 | Source: Ambulatory Visit | Attending: Obstetrics and Gynecology | Admitting: Obstetrics and Gynecology

## 2013-12-04 ENCOUNTER — Encounter (INDEPENDENT_AMBULATORY_CARE_PROVIDER_SITE_OTHER): Payer: Self-pay

## 2013-12-04 DIAGNOSIS — R928 Other abnormal and inconclusive findings on diagnostic imaging of breast: Secondary | ICD-10-CM

## 2014-04-23 ENCOUNTER — Ambulatory Visit (INDEPENDENT_AMBULATORY_CARE_PROVIDER_SITE_OTHER): Payer: 59 | Admitting: Internal Medicine

## 2014-04-23 ENCOUNTER — Encounter: Payer: Self-pay | Admitting: Internal Medicine

## 2014-04-23 VITALS — BP 120/80 | HR 99 | Temp 98.1°F | Resp 20 | Ht 65.0 in | Wt 169.0 lb

## 2014-04-23 DIAGNOSIS — L309 Dermatitis, unspecified: Secondary | ICD-10-CM

## 2014-04-23 DIAGNOSIS — L259 Unspecified contact dermatitis, unspecified cause: Secondary | ICD-10-CM

## 2014-04-23 MED ORDER — BETAMETHASONE DIPROPIONATE 0.05 % EX OINT: TOPICAL_OINTMENT | Freq: Two times a day (BID) | CUTANEOUS | Status: DC

## 2014-04-23 NOTE — Patient Instructions (Signed)
Call or return to clinic prn if these symptoms worsen or fail to improve as anticipated.

## 2014-04-23 NOTE — Progress Notes (Signed)
Pre visit review using our clinic review tool, if applicable. No additional management support is needed unless otherwise documented below in the visit note. 

## 2014-04-23 NOTE — Progress Notes (Signed)
Subjective:    Patient ID: Morgan Shaffer, female    DOB: 20-Nov-1970, 43 y.o.   MRN: [id number]  HPI   43 year old patient who presents with a 4 to five-month history of a chronic dermatitis involving her right lateral foot;  this initially began as pruritic rash.  She has tried a number of topical therapies including antifungal such as Lamisil and Lotrimin.  She also states that she had a very similar to her dermatitis in the past and was also very fat-free to treatment and following responded to oral Lamisil. Rashes, still somewhat pruritic, but she states that she is able to avoid any scratching;  it has become darker and thickened.  Past Medical History  Diagnosis Date  . History of TIAs     09/2009 and 02/2011; no residual deficit  . 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 PFO (patent foramen ovale)   . Mitral regurgitation     mild  . Migraine headache   . Mild tricuspid regurgitation   . Asthma   . Chicken pox   . Allergy     hay fever  . Microcytic anemia 10/18/2011  . Dysmenorrhea   . Iron deficiency anemia due to chronic blood loss   . Shortness of breath   . Refusal of blood transfusions as patient is Jehovah's Witness     History   Social History  . Marital Status: Single    Spouse Name: N/A    Number of Children: 2  . Years of Education: N/A   Occupational History  .        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  [employer]   Social History Main Topics  . Smoking status: Never Smoker   . Smokeless tobacco: Never Used  . Alcohol Use: Yes     Comment: ocassional  . Drug Use: No  . Sexual Activity: No   Other Topics Concern  . Not on file   Social History Narrative  . No narrative on file    Past Surgical History  Procedure Laterality Date  . Patent foramen ovale closure  02/02/12    Family History  Problem Relation Age of Onset  . Hypertension      positive family hx of  . Alcohol abuse    .  Arrhythmia Mother   . Arthritis Maternal Grandmother   . Alcohol abuse Maternal Grandfather   . Alcohol abuse Paternal Grandmother   . Cancer Maternal Uncle     Prostate Cancer  . Cancer Maternal Uncle     Prostate Cancer    Allergies  Allergen Reactions  . Aspirin Anaphylaxis  . Lisinopril Anaphylaxis and Hives  . Penicillins Itching and Swelling    Current Outpatient Prescriptions on File Prior to Visit  Medication Sig Dispense Refill  . Multiple Vitamin (MULTIVITAMIN) tablet Take 1 tablet by mouth as needed.       No current facility-administered medications on file prior to visit.    BP 120/80  Pulse 99  Temp(Src) 98.1 F (36.7 C) (Oral)  Resp 20  Ht 5\' 5"  (1.651 m)  Wt 169 lb (76.658 kg)  BMI 28.12 kg/m2  SpO2 96%    Review of Systems  Constitutional: Negative.   HENT: Negative for congestion, dental problem, hearing loss, rhinorrhea, sinus pressure, sore throat and tinnitus.   Eyes: Negative for pain, discharge and visual disturbance.  Respiratory: Negative for cough  and shortness of breath.   Cardiovascular: Negative for chest pain, palpitations and leg swelling.  Gastrointestinal: Negative for nausea, vomiting, abdominal pain, diarrhea, constipation, blood in stool and abdominal distention.  Genitourinary: Negative for dysuria, urgency, frequency, hematuria, flank pain, vaginal bleeding, vaginal discharge, difficulty urinating, vaginal pain and pelvic pain.  Musculoskeletal: Negative for arthralgias, gait problem and joint swelling.  Skin: Positive for rash.  Neurological: Negative for dizziness, syncope, speech difficulty, weakness, numbness and headaches.  Hematological: Negative for adenopathy.  Psychiatric/Behavioral: Negative for behavioral problems, dysphoric mood and agitation. The patient is not nervous/anxious.        Objective:   Physical Exam  Constitutional: She appears well-developed and well-nourished. No distress.  Skin:  Approximately 5  cm dry, scaly, hyperpigmented rash noted involving the lateral aspect of her right dorsal foot          Assessment & Plan:   Chronic dermatitis, right foot.  Will treat with betamethasone twice a day.  Local skin care discussed.  Will call if unimproved

## 2014-07-03 ENCOUNTER — Encounter (HOSPITAL_COMMUNITY): Payer: Self-pay | Admitting: Cardiovascular Disease

## 2014-10-09 ENCOUNTER — Ambulatory Visit (INDEPENDENT_AMBULATORY_CARE_PROVIDER_SITE_OTHER): Payer: 59 | Admitting: Cardiovascular Disease

## 2014-10-09 ENCOUNTER — Ambulatory Visit (HOSPITAL_COMMUNITY): Payer: 59 | Attending: Internal Medicine

## 2014-10-09 ENCOUNTER — Encounter: Payer: Self-pay | Admitting: Cardiovascular Disease

## 2014-10-09 ENCOUNTER — Other Ambulatory Visit: Payer: Self-pay

## 2014-10-09 VITALS — BP 126/82 | HR 59 | Ht 65.0 in | Wt 176.1 lb

## 2014-10-09 DIAGNOSIS — Q2112 Patent foramen ovale: Secondary | ICD-10-CM

## 2014-10-09 DIAGNOSIS — Q211 Atrial septal defect: Secondary | ICD-10-CM

## 2014-10-09 NOTE — Progress Notes (Signed)
2D Echo completed. 10/09/2014

## 2014-10-09 NOTE — Progress Notes (Signed)
Cardiology Office Note   Date:  10/09/2014   ID:  Morgan Shaffer, DOB [birth year], MRN [id number]  PCP:  [name], MD  Cardiologist:  Sherren Mocha, MD    No chief complaint on file.   History of Present Illness: Morgan Shaffer is a 44 y.o. female who presents for follow-up evaluation.   She has a hx of PFO and cryptogenic stroke. She underwent transcatheter PFO closure in July 8315 without complications. A gore helex device was utilized. She is off of all antiplatelet Rx and has had no symptoms of recurrent stroke or TIA. The patient has chronic iron deficiency anemia, but at the time of her last CBC 1 year ago her hemoglobin was 12.4. She attributes this to taking iron regularly.  The patient is doing well. She's had no stroke or TIA symptoms. She denies chest pain, heart palpitations, or shortness of breath. She feels well and has no complaints today. Her son is playing [employer] basketball and she's been dizzy with that. She also continues to work full-time.her daughter is now a Paramedic at [employer].   Past Medical History  Diagnosis Date  . History of TIAs     09/2009 and 02/2011; no residual deficit  . 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 PFO (patent foramen ovale)   . Mitral regurgitation     mild  . Migraine headache   . Mild tricuspid regurgitation   . Asthma   . Chicken pox   . Allergy     hay fever  . Microcytic anemia 10/18/2011  . Dysmenorrhea   . Iron deficiency anemia due to chronic blood loss   . Shortness of breath   . Refusal of blood transfusions as patient is Jehovah's Witness     Past Surgical History  Procedure Laterality Date  . Patent foramen ovale closure  02/02/12  . Patent foramen ovale closure N/A 02/02/2012    Procedure: PATENT FORAMEN OVALE CLOSURE;  Surgeon: Sherren Mocha, MD;  Location: [facility] CATH LAB;  Service: Cardiovascular;  Laterality: N/A;    Current Outpatient Prescriptions    Medication Sig Dispense Refill  . Multiple Vitamin (MULTIVITAMIN) tablet Take 1 tablet by mouth daily as needed (vitamin supplement).      No current facility-administered medications for this visit.    Allergies:   Aspirin; Lisinopril; and Penicillins   Social History:  The patient  reports that she has never smoked. She has never used smokeless tobacco. She reports that she drinks alcohol. She reports that she does not use illicit drugs.   Family History:  The patient's  family history includes Alcohol abuse in her maternal grandfather, paternal grandmother, and another family member; Arrhythmia in her mother; Arthritis in her maternal grandmother; Cancer in her maternal uncle and maternal uncle; Hypertension in an other family member.    PHYSICAL EXAM: VS:  BP 126/82 mmHg  Pulse 59  Ht 5\' 5"  (1.651 m)  Wt 176 lb 1.9 oz (79.888 kg)  BMI 29.31 kg/m2 , BMI Body mass index is 29.31 kg/(m^2). GEN: Well nourished, well developed, in no acute distress HEENT: normal Neck: no JVD, no masses. No carotid bruits Cardiac: RRR without murmur or gallop                Respiratory:  clear to auscultation bilaterally, normal work of breathing GI: soft, nontender, nondistended, + BS MS: no deformity or atrophy Ext: no pretibial edema, pedal pulses  2+= bilaterally Skin: warm and dry, no rash Neuro:  Strength and sensation are intact Psych: euthymic mood, full affect  EKG:  EKG is ordered today. The ekg ordered today shows normal sinus rhythm 59 bpm, within normal limits.  Recent Labs: 10/11/2013: Hemoglobin 12.4; Platelets 290.0   Lipid Panel     Component Value Date/Time   CHOL 167 03/23/2011 0606   TRIG 103 03/23/2011 0606   HDL 57 03/23/2011 0606   CHOLHDL 2.9 03/23/2011 0606   VLDL 21 03/23/2011 0606   LDLCALC 89 03/23/2011 0606      Wt Readings from Last 3 Encounters:  10/09/14 176 lb 1.9 oz (79.888 kg)  04/23/14 169 lb (76.658 kg)  10/11/13 167 lb (75.751 kg)     Cardiac  Studies Reviewed: 2-D echocardiogram personally reviewed. Atrial septal occluder device is in position. There is no obvious shunt by color flow. LV function is normal. There is no pericardial effusion. There is no significant valvular disease.  Formal echo interpretation is currently pending.  ASSESSMENT AND PLAN: PFO with history of cryptogenic stroke: Patient is doing well after device closure of her PFO. She's had no recurrent symptoms. I'll see her back in one year with a limited echocardiogram and agitated saline study.  Current medicines are reviewed with the patient today.  The patient does not have concerns regarding medicines.  The following changes have been made:  no change  Labs/ tests ordered today include:  No orders of the defined types were placed in this encounter.    Disposition:   FU One year  Signed, Sherren Mocha, MD  10/09/2014 2:34 PM    [address], [place], [place]  [address] Phone: [phone]; Fax: [phone]

## 2014-10-09 NOTE — Patient Instructions (Addendum)
Your physician has requested that you have a LIMITED echocardiogram with [facility] in 1 YEAR. Echocardiography is a painless test that uses sound waves to create images of your heart. It provides your doctor with information about the size and shape of your heart and how well your heart's chambers and valves are working. This procedure takes approximately one hour. There are no restrictions for this procedure.  Your physician wants you to follow-up in: 1 YEAR with Dr Burt Knack.  You will receive a reminder letter in the mail two months in advance. If you don't receive a letter, please call our office to schedule the follow-up appointment.  Your physician recommends that you continue on your current medications as directed. Please refer to the Current Medication list given to you today.

## 2015-01-22 ENCOUNTER — Ambulatory Visit (INDEPENDENT_AMBULATORY_CARE_PROVIDER_SITE_OTHER): Payer: 59 | Admitting: Internal Medicine

## 2015-01-22 ENCOUNTER — Encounter: Payer: Self-pay | Admitting: Internal Medicine

## 2015-01-22 VITALS — BP 140/80 | HR 79 | Temp 98.1°F | Resp 20 | Ht 65.0 in | Wt 185.0 lb

## 2015-01-22 DIAGNOSIS — K1120 Sialoadenitis, unspecified: Secondary | ICD-10-CM | POA: Diagnosis not present

## 2015-01-22 NOTE — Patient Instructions (Addendum)
Sialadenitis  Sialadenitis is an inflammation (soreness) of the salivary glands. The parotid is the main salivary gland. It lies behind the angle of the jaw below the ear. The saliva produced comes out of a tiny opening (duct) inside the cheek on either side. This is usually at the level of the upper back teeth. If it is swollen, the ear is pushed up and out. This helps tell this condition apart from a simple lymph gland infection (swollen glands) in the same area. Mumps has mostly disappeared since the start of immunization against mumps. Now the most common cause of parotitis is germ (bacterial) infection or inflammation of the lymphatics (the lymph channels). The other major salivary gland is located in the floor of the mouth. Smaller salivary glands are located in the mouth. This includes the:   Lips.   Lining of the mouth.   Pharynx.   Hard palate (front part of the roof of the mouth).  The salivary glands do many things, including:   Lubrication.   Breaking down food.   Production of hormones and antibodies (to protect against germs which may cause illness).   Help with the sense of taste.  ACUTE BACTERIAL SIALADENITIS  This is a sudden inflammatory response to bacterial infection. This causes redness, pain, swelling and tenderness over the infected gland. In the past, it was common in dehydrated and debilitated patients often following an operation. It is now more commonly seen:   After radiotherapy.   In patients with poor immune systems.  Treatment is:   The correction of fluid balance (rehydration).   Medicine that kill germs (antibiotics).   Pain relief.  CHRONIC RECURRENT SIALADENITIS  This refers to repeated episodes of discomfort and swelling of one of the salivary glands. It often occurs after eating. Chronic sialadenitis is usually less painful. It is associated with recurrent enlargement of a salivary gland, often following meals, and typically with an absence of redness. The chronic  form of the disease often is associated with conditions linked to decreased salivary flow, rather than dehydration (loss of body fluids). These conditions include:   A stone, or concretion, formed in the gallbladder, kidneys, or other parts of the body (calculi).   Salivary stasis.   A change in the fluid and electrolyte (the salts in your body fluids) makeup of the gland.  It is treated with:   Gland massage.   Methods to stimulate the flow of saliva, (for example, lemon juice).   Antibiotics if required.  Surgery to remove the gland is possible, but its benefits need to be balanced against risks.   VIRAL SIALADENITIS  Several viruses infect the salivary glands. Some of these include the mumps virus that commonly infects the parotid gland. Other viruses causing problems are:   The HIV virus.   Herpes.   Some of the influenza ("flu") viruses.  RECURRENT SIALADENITIS IN CHILDREN  This condition is thought to be due to swelling or ballooning of the ducts. It results in the same symptoms as acute bacterial parotitis. It is usually caused by germs (bacteria). It is often treated using penicillin. It may get well without treatment. Surgery is usually not required.  TUBERCULOUS SIALADENITIS  The salivary glands may become infected with the same bacteria causing tuberculosis ("TB"). Treatment is with anti-tuberculous antibiotic therapy.  OTHER UNCOMMON CAUSES OF SIALADENITIS    Sjogren's syndrome is a condition in which arthritis is associated with a decrease in activity of the glands of the body that produce saliva   enlargement.  Atypical mycobacteria is a germ similar to tuberculosis. It often infects children. It is often resistant to antibiotic treatment. It may require surgical treatment to remove  the infected salivary gland.  Actinomycosis is an infection of the parotid gland that may also involve the overlying skin. The diagnosis is made by detecting granules of sulphur produced by the bacteria on microscopic examination. Treatment is a prolonged course of penicillin for up to one year.  Nutritional causes include vitamin deficiencies and bulimia.  Diabetes and problems with your thyroid.  Obesity, cirrhosis, and malabsorption are some metabolic causes. HOME CARE INSTRUCTIONS   Apply ice bags every 2 hours for 15-20 minutes, while awake, to the sore gland for 24 hours, then as directed by your caregiver. Place the ice in a plastic bag with a towel around it to prevent frostbite to the skin.  Only take over-the-counter or prescription medicines for pain, discomfort, or fever as directed by your caregiver. SEEK IMMEDIATE MEDICAL CARE IF:   There is increased pain or swelling in your gland that is not controlled with medicine.  An oral temperature above 102 F (38.9 C) develops, not controlled by medicine.  You develop difficulty opening your mouth, swallowing, or speaking. Document Released: 12/31/2001 Document Revised: 10/03/2011 Document Reviewed: 02/25/2008 [facility] Patient Information 2015 [place], [place]. This information is not intended to replace advice given to you by your health care provider. Make sure you discuss any questions you have with your health care provider.  Drink as much fluid as you  can tolerate over the next few days Apply warm heat to the left neck area 2 or 3 times daily Gentle massage to the involved areas 2 or 3 times daily Consider tart  candy such as lemon drops throughout the day Take 400-600 mg of ibuprofen ( Advil, Motrin) with food every 4 to 6 hours as needed for pain relief or control of fever

## 2015-01-22 NOTE — Progress Notes (Signed)
Pre visit review using our clinic review tool, if applicable. No additional management support is needed unless otherwise documented below in the visit note. 

## 2015-01-22 NOTE — Progress Notes (Signed)
Subjective:    Patient ID: Morgan Shaffer, female    DOB: [birth year], 44 y.o.   MRN: [id number]  HPI 44 year old healthy individual with a 3-4 week history of fullness and discomfort in the left anterior neck area as well as in the left sublingual region.  Earlier she had some fullness in the left ear was evaluated by ENT and this has resolved with ear wax removal.  At the time of her ENT examination.  The left anterior cervical nodule had resolved, but more recently has reoccurred.  She was felt by ENT to have left lingual sialolithiasis.  Past Medical History  Diagnosis Date  . History of TIAs     09/2009 and 02/2011; no residual deficit  . 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 PFO (patent foramen ovale)   . Mitral regurgitation     mild  . Migraine headache   . Mild tricuspid regurgitation   . Asthma   . Chicken pox   . Allergy     hay fever  . Microcytic anemia 10/18/2011  . Dysmenorrhea   . Iron deficiency anemia due to chronic blood loss   . Shortness of breath   . Refusal of blood transfusions as patient is Jehovah's Witness     History   Social History  . Marital Status: Single    Spouse Name: N/A  . Number of Children: 2  . Years of Education: N/A   Occupational History  .        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  [employer]   Social History Main Topics  . Smoking status: Never Smoker   . Smokeless tobacco: Never Used  . Alcohol Use: Yes     Comment: ocassional  . Drug Use: No  . Sexual Activity: No   Other Topics Concern  . Not on file   Social History Narrative    Past Surgical History  Procedure Laterality Date  . Patent foramen ovale closure  02/02/12  . Patent foramen ovale closure N/A 02/02/2012    Procedure: PATENT FORAMEN OVALE CLOSURE;  Surgeon: Sherren Mocha, MD;  Location: [facility] CATH LAB;  Service: Cardiovascular;  Laterality: N/A;    Family History  Problem Relation Age of Onset  .  Hypertension      positive family hx of  . Alcohol abuse    . Arrhythmia Mother   . Arthritis Maternal Grandmother   . Alcohol abuse Maternal Grandfather   . Alcohol abuse Paternal Grandmother   . Cancer Maternal Uncle     Prostate Cancer  . Cancer Maternal Uncle     Prostate Cancer    Allergies  Allergen Reactions  . Aspirin Anaphylaxis  . Lisinopril Anaphylaxis and Hives  . Penicillins Itching and Swelling    Current Outpatient Prescriptions on File Prior to Visit  Medication Sig Dispense Refill  . Multiple Vitamin (MULTIVITAMIN) tablet Take 1 tablet by mouth daily as needed (vitamin supplement).      No current facility-administered medications on file prior to visit.    BP 140/80 mmHg  Pulse 79  Temp(Src) 98.1 F (36.7 C) (Oral)  Resp 20  Ht 5\' 5"  (1.651 m)  Wt 185 lb (83.915 kg)  BMI 30.79 kg/m2  SpO2 98%      Review of Systems  Constitutional: Negative.   HENT: Negative for congestion, dental problem, hearing loss, rhinorrhea, sinus pressure, sore throat and tinnitus.  Eyes: Negative for pain, discharge and visual disturbance.  Respiratory: Negative for cough and shortness of breath.   Cardiovascular: Negative for chest pain, palpitations and leg swelling.  Gastrointestinal: Negative for nausea, vomiting, abdominal pain, diarrhea, constipation, blood in stool and abdominal distention.  Genitourinary: Negative for dysuria, urgency, frequency, hematuria, flank pain, vaginal bleeding, vaginal discharge, difficulty urinating, vaginal pain and pelvic pain.  Musculoskeletal: Negative for joint swelling, arthralgias and gait problem.  Skin: Negative for rash.  Neurological: Negative for dizziness, syncope, speech difficulty, weakness, numbness and headaches.  Hematological: Negative for adenopathy.  Psychiatric/Behavioral: Negative for behavioral problems, dysphoric mood and agitation. The patient is not nervous/anxious.        Objective:   Physical Exam    Constitutional: She is oriented to person, place, and time. She appears well-developed and well-nourished.  Repeat blood pressure 122/80  HENT:  Head: Normocephalic.  Right Ear: External ear normal.  Left Ear: External ear normal.  Mouth/Throat: Oropharynx is clear and moist.  Eyes: Conjunctivae and EOM are normal. Pupils are equal, round, and reactive to light.  Neck: Normal range of motion. Neck supple. No thyromegaly present.  2 cm slightly tender left anterior cervical mass Tenderness and induration in the left sub  lingual area  Cardiovascular: Normal rate, regular rhythm, normal heart sounds and intact distal pulses.   Pulmonary/Chest: Effort normal and breath sounds normal.  Abdominal: Soft. Bowel sounds are normal. She exhibits no mass. There is no tenderness.  Musculoskeletal: Normal range of motion.  Lymphadenopathy:    She has no cervical adenopathy.  Neurological: She is alert and oriented to person, place, and time.  Skin: Skin is warm and dry. No rash noted.  Psychiatric: She has a normal mood and affect. Her behavior is normal.          Assessment & Plan:   Submandibular and sublingual sialolithiasis.  Patient information dispensed.  The patient has been asked to stay well hydrated and force fluids.  She will take ibuprofen when necessary.  She will try a gentle massage and moist heat to the left anterior neck area.  She has also been asked to use lemon drops liberally ENT follow-up if unimproved

## 2015-04-17 LAB — HM PAP SMEAR: HM Pap smear: NEGATIVE

## 2015-04-21 ENCOUNTER — Encounter: Payer: Self-pay | Admitting: Internal Medicine

## 2015-04-30 ENCOUNTER — Encounter: Payer: Self-pay | Admitting: Internal Medicine

## 2016-02-10 ENCOUNTER — Encounter (HOSPITAL_COMMUNITY): Payer: Self-pay | Admitting: Emergency Medicine

## 2016-02-10 ENCOUNTER — Emergency Department (HOSPITAL_COMMUNITY)
Admission: EM | Admit: 2016-02-10 | Discharge: 2016-02-10 | Disposition: A | Discharge status: Home / Self Care | Payer: 59 | Attending: Emergency Medicine | Admitting: Emergency Medicine

## 2016-02-10 DIAGNOSIS — Z8673 Personal history of transient ischemic attack (TIA), and cerebral infarction without residual deficits: Secondary | ICD-10-CM | POA: Diagnosis not present

## 2016-02-10 DIAGNOSIS — M25472 Effusion, left ankle: Secondary | ICD-10-CM | POA: Diagnosis not present

## 2016-02-10 DIAGNOSIS — M25471 Effusion, right ankle: Secondary | ICD-10-CM | POA: Insufficient documentation

## 2016-02-10 DIAGNOSIS — J45909 Unspecified asthma, uncomplicated: Secondary | ICD-10-CM | POA: Insufficient documentation

## 2016-02-10 DIAGNOSIS — M25473 Effusion, unspecified ankle: Secondary | ICD-10-CM

## 2016-02-10 LAB — URINALYSIS, ROUTINE W REFLEX MICROSCOPIC
BILIRUBIN URINE: NEGATIVE
GLUCOSE, UA: NEGATIVE mg/dL
Ketones, ur: NEGATIVE mg/dL
Nitrite: NEGATIVE
PROTEIN: NEGATIVE mg/dL
SPECIFIC GRAVITY, URINE: 1.018 (ref 1.005–1.030)
pH: 6 (ref 5.0–8.0)

## 2016-02-10 LAB — BASIC METABOLIC PANEL
Anion gap: 7 (ref 5–15)
BUN: 10 mg/dL (ref 6–20)
CO2: 20 mmol/L — ABNORMAL LOW (ref 22–32)
CREATININE: 0.88 mg/dL (ref 0.44–1.00)
Calcium: 9 mg/dL (ref 8.9–10.3)
Chloride: 108 mmol/L (ref 101–111)
GFR calc Af Amer: >60 mL/min (ref >60)
GLUCOSE: 104 mg/dL — AB (ref 65–99)
POTASSIUM: 3.7 mmol/L (ref 3.5–5.1)
SODIUM: 135 mmol/L (ref 135–145)

## 2016-02-10 LAB — CBC WITH DIFFERENTIAL/PLATELET
Basophils Absolute: 0 10*3/uL (ref 0.0–0.1)
Basophils Relative: 0 %
EOS ABS: 0.1 10*3/uL (ref 0.0–0.7)
EOS PCT: 2 %
HCT: 32.6 % — ABNORMAL LOW (ref 36.0–46.0)
Hemoglobin: 10.2 g/dL — ABNORMAL LOW (ref 12.0–15.0)
LYMPHS ABS: 2.2 10*3/uL (ref 0.7–4.0)
LYMPHS PCT: 31 %
MCH: 23 pg — AB (ref 26.0–34.0)
MCHC: 31.3 g/dL (ref 30.0–36.0)
MCV: 73.6 fL — AB (ref 78.0–100.0)
MONO ABS: 0.5 10*3/uL (ref 0.1–1.0)
MONOS PCT: 7 %
Neutro Abs: 4.4 10*3/uL (ref 1.7–7.7)
Neutrophils Relative %: 60 %
PLATELETS: 393 10*3/uL (ref 150–400)
RBC: 4.43 MIL/uL (ref 3.87–5.11)
RDW: 16.4 % — ABNORMAL HIGH (ref 11.5–15.5)
WBC: 7.2 10*3/uL (ref 4.0–10.5)

## 2016-02-10 LAB — URINE MICROSCOPIC-ADD ON

## 2016-02-10 LAB — POC URINE PREG, ED: PREG TEST UR: NEGATIVE

## 2016-02-10 NOTE — ED Provider Notes (Signed)
CSN: [id number]     Arrival date & time 02/10/16  1940 History   First MD Initiated Contact with Patient 02/10/16 2150     Chief Complaint  Patient presents with  . Leg Swelling     The history is provided by the patient.  Patient presents with some swelling in her feet and ankles. Began after a brief flight to [employer]. Slight itself surrounded [place] a half. States her feet were swollen on both sides and ankles are more swollen. Otherwise she is feeling fine. Does not have swelling this before. No chest pain or shortness of breath. No fevers or chills. No other swelling or legs. She does not smoke. Previous history of stroke but states it was due to a PFO. States she had a hypercoagulable workup was negative. She is not on birth control pills.  Past Medical History  Diagnosis Date  . History of TIAs     09/2009 and 02/2011; no residual deficit  . 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 PFO (patent foramen ovale)   . Mitral regurgitation     mild  . Migraine headache   . Mild tricuspid regurgitation   . Asthma   . Chicken pox   . Allergy     hay fever  . Microcytic anemia 10/18/2011  . Dysmenorrhea   . Iron deficiency anemia due to chronic blood loss   . Shortness of breath   . Refusal of blood transfusions as patient is Jehovah's Witness    Past Surgical History  Procedure Laterality Date  . Patent foramen ovale closure  02/02/12  . Patent foramen ovale closure N/A 02/02/2012    Procedure: PATENT FORAMEN OVALE CLOSURE;  Surgeon: Sherren Mocha, MD;  Location: [facility] CATH LAB;  Service: Cardiovascular;  Laterality: N/A;   Family History  Problem Relation Age of Onset  . Hypertension      positive family hx of  . Alcohol abuse    . Arrhythmia Mother   . Arthritis Maternal Grandmother   . Alcohol abuse Maternal Grandfather   . Alcohol abuse Paternal Grandmother   . Cancer Maternal Uncle     Prostate Cancer  . Cancer Maternal Uncle      Prostate Cancer   Social History  Substance Use Topics  . Smoking status: Never Smoker   . Smokeless tobacco: Never Used  . Alcohol Use: Yes     Comment: ocassional   OB History    No data available     Review of Systems  Constitutional: Negative for appetite change.  Respiratory: Negative for shortness of breath.   Cardiovascular: Positive for leg swelling. Negative for chest pain.  Gastrointestinal: Negative for abdominal pain.  Genitourinary: Negative for dysuria.  Musculoskeletal: Negative for back pain.  Skin: Negative for wound.  Neurological: Negative for light-headedness.  Hematological: Negative for adenopathy.  Psychiatric/Behavioral: Negative for confusion.      Allergies  Aspirin; Lisinopril; and Penicillins  Home Medications   Prior to Admission medications   Not on File   BP 136/91 mmHg  Pulse 88  Temp(Src) 99 F (37.2 C) (Oral)  Resp 16  Ht 5\' 5"  (1.651 m)  Wt 195 lb (88.451 kg)  BMI 32.45 kg/m2  SpO2 99%  LMP 02/03/2016 (Approximate) Physical Exam  Constitutional: She appears well-developed.  HENT:  Head: Atraumatic.  Eyes: EOM are normal.  Neck: Neck supple.  Cardiovascular: Normal rate.   Pulmonary/Chest: Effort normal.  Abdominal: There is no tenderness.  Musculoskeletal: She exhibits edema.  Very mild edema and bilateral ankles. No foot swelling. swelling. Sensation intact.  Neurological: She is alert.  Skin: Skin is warm.    ED Course  Procedures (including critical care time) Labs Review Labs Reviewed  CBC WITH DIFFERENTIAL/PLATELET - Abnormal; Notable for the following:    Hemoglobin 10.2 (*)    HCT 32.6 (*)    MCV 73.6 (*)    MCH 23.0 (*)    RDW 16.4 (*)    All other components within normal limits  BASIC METABOLIC PANEL - Abnormal; Notable for the following:    CO2 20 (*)    Glucose, Bld 104 (*)    All other components within normal limits  URINALYSIS, ROUTINE W REFLEX MICROSCOPIC (NOT AT [facility]) - Abnormal;  Notable for the following:    APPearance CLOUDY (*)    Hgb urine dipstick SMALL (*)    Leukocytes, UA SMALL (*)    All other components within normal limits  URINE MICROSCOPIC-ADD ON - Abnormal; Notable for the following:    Squamous Epithelial / LPF 0-5 (*)    Bacteria, UA RARE (*)    All other components within normal limits  POC URINE PREG, ED    Imaging Review No results found. I have personally reviewed and evaluated these images and lab results as part of my medical decision-making.   EKG Interpretation None      MDM   Final diagnoses:  Ankle swelling, unspecified laterality    Patient with slight swelling in her ankles. Doubt dvt . Kidney function looks good. Will discharge home.   Davonna Belling, MD 02/10/16 [phone]

## 2016-02-10 NOTE — Discharge Instructions (Signed)

## 2016-02-10 NOTE — ED Notes (Signed)
Pt. reports bilateral ankle swelling onset today , denies injury , pt. stated she had a flight from [place] yesterday , respirations unlabored/deneis fever .

## 2016-02-22 ENCOUNTER — Ambulatory Visit (INDEPENDENT_AMBULATORY_CARE_PROVIDER_SITE_OTHER): Payer: 59 | Admitting: Internal Medicine

## 2016-02-22 ENCOUNTER — Encounter: Payer: Self-pay | Admitting: Internal Medicine

## 2016-02-22 VITALS — BP 110/80 | HR 70 | Ht 65.0 in | Wt 191.0 lb

## 2016-02-22 DIAGNOSIS — R635 Abnormal weight gain: Secondary | ICD-10-CM

## 2016-02-22 DIAGNOSIS — Q2112 Patent foramen ovale: Secondary | ICD-10-CM

## 2016-02-22 DIAGNOSIS — Q211 Atrial septal defect: Secondary | ICD-10-CM

## 2016-02-22 DIAGNOSIS — D509 Iron deficiency anemia, unspecified: Secondary | ICD-10-CM | POA: Diagnosis not present

## 2016-02-22 NOTE — Patient Instructions (Addendum)
Limit your sodium (Salt) intake    It is important that you exercise regularly, at least 20 minutes 3 to 4 times per week.  If you develop chest pain or shortness of breath seek  medical attention.  You need to lose weight.  Consider a lower calorie diet and regular exercise.  DASH Eating Plan DASH stands for "Dietary Approaches to Stop Hypertension." The DASH eating plan is a healthy eating plan that has been shown to reduce high blood pressure (hypertension). Additional health benefits may include reducing the risk of type 2 diabetes mellitus, heart disease, and stroke. The DASH eating plan may also help with weight loss. WHAT DO I NEED TO KNOW ABOUT THE DASH EATING PLAN? For the DASH eating plan, you will follow these general guidelines:  Choose foods with a percent daily value for sodium of less than 5% (as listed on the food label).  Use salt-free seasonings or herbs instead of table salt or sea salt.  Check with your health care provider or pharmacist before using salt substitutes.  Eat lower-sodium products, often labeled as "lower sodium" or "no salt added."  Eat fresh foods.  Eat more vegetables, fruits, and low-fat dairy products.  Choose whole grains. Look for the word "whole" as the first word in the ingredient list.  Choose fish and skinless chicken or [place] more often than red meat. Limit fish, poultry, and meat to 6 oz (170 g) each day.  Limit sweets, desserts, sugars, and sugary drinks.  Choose heart-healthy fats.  Limit cheese to 1 oz (28 g) per day.  Eat more home-cooked food and less restaurant, buffet, and fast food.  Limit fried foods.  Cook foods using methods other than frying.  Limit canned vegetables. If you do use them, rinse them well to decrease the sodium.  When eating at a restaurant, ask that your food be prepared with less salt, or no salt if possible. WHAT FOODS CAN I EAT? Seek help from a dietitian for individual calorie  needs. Grains Whole grain or whole wheat bread. Brown rice. Whole grain or whole wheat pasta. Quinoa, bulgur, and whole grain cereals. Low-sodium cereals. Corn or whole wheat flour tortillas. Whole grain cornbread. Whole grain crackers. Low-sodium crackers. Vegetables Fresh or frozen vegetables (raw, steamed, roasted, or grilled). Low-sodium or reduced-sodium tomato and vegetable juices. Low-sodium or reduced-sodium tomato sauce and paste. Low-sodium or reduced-sodium canned vegetables.  Fruits All fresh, canned (in natural juice), or frozen fruits. Meat and Other Protein Products Ground beef (85% or leaner), grass-fed beef, or beef trimmed of fat. Skinless chicken or [place]. Ground chicken or [place]. Pork trimmed of fat. All fish and seafood. Eggs. Dried beans, peas, or lentils. Unsalted nuts and seeds. Unsalted canned beans. Dairy Low-fat dairy products, such as skim or 1% milk, 2% or reduced-fat cheeses, low-fat ricotta or cottage cheese, or plain low-fat yogurt. Low-sodium or reduced-sodium cheeses. Fats and Oils Tub margarines without trans fats. Light or reduced-fat mayonnaise and salad dressings (reduced sodium). Avocado. Safflower, olive, or canola oils. Natural peanut or almond butter. Other Unsalted popcorn and pretzels. The items listed above may not be a complete list of recommended foods or beverages. Contact your dietitian for more options. WHAT FOODS ARE NOT RECOMMENDED? Grains White bread. White pasta. White rice. Refined cornbread. Bagels and croissants. Crackers that contain trans fat. Vegetables Creamed or fried vegetables. Vegetables in a cheese sauce. Regular canned vegetables. Regular canned tomato sauce and paste. Regular tomato and vegetable juices. Fruits Dried fruits. Canned fruit  in light or heavy syrup. Fruit juice. Meat and Other Protein Products Fatty cuts of meat. Ribs, chicken wings, bacon, sausage, bologna, salami, chitterlings, fatback, hot dogs, bratwurst,  and packaged luncheon meats. Salted nuts and seeds. Canned beans with salt. Dairy Whole or 2% milk, cream, half-and-half, and cream cheese. Whole-fat or sweetened yogurt. Full-fat cheeses or blue cheese. Nondairy creamers and whipped toppings. Processed cheese, cheese spreads, or cheese curds. Condiments Onion and garlic salt, seasoned salt, table salt, and sea salt. Canned and packaged gravies. Worcestershire sauce. Tartar sauce. Barbecue sauce. Teriyaki sauce. Soy sauce, including reduced sodium. Steak sauce. Fish sauce. Oyster sauce. Cocktail sauce. Horseradish. Ketchup and mustard. Meat flavorings and tenderizers. Bouillon cubes. Hot sauce. Tabasco sauce. Marinades. Taco seasonings. Relishes. Fats and Oils Butter, stick margarine, lard, shortening, ghee, and bacon fat. Coconut, palm kernel, or palm oils. Regular salad dressings. Other Pickles and olives. Salted popcorn and pretzels. The items listed above may not be a complete list of foods and beverages to avoid. Contact your dietitian for more information. WHERE CAN I FIND MORE INFORMATION? National Heart, Lung, and Blood Institute: travelstabloid.com   This information is not intended to replace advice given to you by your health care provider. Make sure you discuss any questions you have with your health care provider.   Document Released: 06/30/2011 Document Revised: 08/01/2014 Document Reviewed: 05/15/2013 Elsevier Interactive Patient Education [employer].

## 2016-02-22 NOTE — Progress Notes (Signed)
Subjective:    Patient ID: Morgan Shaffer, female    DOB: [birth year], 45 y.o.   MRN: [id number]  HPI   45 year old patient who was seen in the ED 12 days ago with peel edema.  This occurred following a trip to [place], on business.  Laboratory screen unremarkable.  She generally feels well today and her pedal edema has resolved.  She was asked to return for follow-up.   Her only complaint is weight gain.  She is requesting a diet  Past Medical History:  Diagnosis Date  . Allergy    hay fever  . Asthma   . Chicken pox   . Dysmenorrhea   . History of TIAs    09/2009 and 02/2011; no residual deficit  . Iron deficiency anemia due to chronic blood loss   . Microcytic anemia 10/18/2011  . Migraine headache   . Mild tricuspid regurgitation   . Mitral regurgitation    mild  . PFO (patent foramen ovale)   . Refusal of blood transfusions as patient is Jehovah's Witness   . Shortness of breath   . Stroke [facility])    11/2008 with aphasia and left sided weakness, no residual deficit     Social History   Social History  . Marital status: Single    Spouse name: N/A  . Number of children: 2  . Years of education: N/A   Occupational History  .        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  .  [employer]   Social History Main Topics  . Smoking status: Never Smoker  . Smokeless tobacco: Never Used  . Alcohol use Yes     Comment: ocassional  . Drug use: No  . Sexual activity: No   Other Topics Concern  . Not on file   Social History Narrative  . No narrative on file    Past Surgical History:  Procedure Laterality Date  . PATENT FORAMEN OVALE CLOSURE  02/02/12  . PATENT FORAMEN OVALE CLOSURE N/A 02/02/2012   Procedure: PATENT FORAMEN OVALE CLOSURE;  Surgeon: Sherren Mocha, MD;  Location: [facility] CATH LAB;  Service: Cardiovascular;  Laterality: N/A;    Family History  Problem Relation Age of Onset  . Hypertension      positive family hx of  . Alcohol abuse    . Arrhythmia  Mother   . Arthritis Maternal Grandmother   . Alcohol abuse Maternal Grandfather   . Alcohol abuse Paternal Grandmother   . Cancer Maternal Uncle     Prostate Cancer  . Cancer Maternal Uncle     Prostate Cancer    Allergies  Allergen Reactions  . Aspirin Anaphylaxis  . Lisinopril Anaphylaxis and Hives  . Penicillins Itching and Swelling    Has patient had a PCN reaction causing immediate rash, facial/tongue/throat swelling, SOB or lightheadedness with hypotension: YES Has patient had a PCN reaction causing severe rash involving mucus membranes or skin necrosis: NO Has patient had a PCN reaction that required hospitalization NO Has patient had a PCN reaction occurring within the last 10 years: NO If all of the above answers are "NO", then may proceed with Cephalosporin use.    No current outpatient prescriptions on file prior to visit.   No current facility-administered medications on file prior to visit.     BP 110/80   Pulse 70   Ht 5\' 5"  (1.651 m)   Wt 191 lb (86.6 kg)   LMP  02/03/2016 (Approximate)   SpO2 97%   BMI 31.78 kg/m     Review of Systems  Constitutional: Positive for unexpected weight change.  HENT: Negative for congestion, dental problem, hearing loss, rhinorrhea, sinus pressure, sore throat and tinnitus.   Eyes: Negative for pain, discharge and visual disturbance.  Respiratory: Negative for cough and shortness of breath.   Cardiovascular: Negative for chest pain, palpitations and leg swelling.  Gastrointestinal: Negative for abdominal distention, abdominal pain, blood in stool, constipation, diarrhea, nausea and vomiting.  Genitourinary: Negative for difficulty urinating, dysuria, flank pain, frequency, hematuria, pelvic pain, urgency, vaginal bleeding, vaginal discharge and vaginal pain.  Musculoskeletal: Negative for arthralgias, gait problem and joint swelling.  Skin: Negative for rash.  Neurological: Negative for dizziness, syncope, speech  difficulty, weakness, numbness and headaches.  Hematological: Negative for adenopathy.  Psychiatric/Behavioral: Negative for agitation, behavioral problems and dysphoric mood. The patient is not nervous/anxious.        Objective:   Physical Exam  Constitutional: She is oriented to person, place, and time. She appears well-developed and well-nourished.  Overweight Blood pressure normal  HENT:  Head: Normocephalic.  Right Ear: External ear normal.  Left Ear: External ear normal.  Mouth/Throat: Oropharynx is clear and moist.  Eyes: Conjunctivae and EOM are normal. Pupils are equal, round, and reactive to light.  Neck: Normal range of motion. Neck supple. No thyromegaly present.  Cardiovascular: Normal rate, regular rhythm, normal heart sounds and intact distal pulses.   Pulmonary/Chest: Effort normal and breath sounds normal. No respiratory distress. She has no wheezes. She has no rales.  Abdominal: Soft. Bowel sounds are normal. She exhibits no mass. There is no tenderness.  Musculoskeletal: Normal range of motion. She exhibits no edema.  No clinical edema  Lymphadenopathy:    She has no cervical adenopathy.  Neurological: She is alert and oriented to person, place, and time.  Skin: Skin is warm and dry. No rash noted.  Psychiatric: She has a normal mood and affect. Her behavior is normal.          Assessment & Plan:   .  History of pedal edema resolved  weight gain.  Dietary information dispensed   exercise regimen encouraged  [name], MD

## 2016-07-04 ENCOUNTER — Other Ambulatory Visit: Payer: Self-pay

## 2016-07-04 DIAGNOSIS — Q211 Atrial septal defect: Secondary | ICD-10-CM

## 2016-07-04 DIAGNOSIS — Q2112 Patent foramen ovale: Secondary | ICD-10-CM

## 2016-07-11 ENCOUNTER — Other Ambulatory Visit: Payer: Self-pay

## 2016-07-11 ENCOUNTER — Ambulatory Visit (HOSPITAL_COMMUNITY): Payer: 59 | Attending: Internal Medicine

## 2016-07-11 DIAGNOSIS — Q2112 Patent foramen ovale: Secondary | ICD-10-CM

## 2016-07-11 DIAGNOSIS — I340 Nonrheumatic mitral (valve) insufficiency: Secondary | ICD-10-CM | POA: Insufficient documentation

## 2016-07-11 DIAGNOSIS — Q211 Atrial septal defect: Secondary | ICD-10-CM | POA: Diagnosis not present

## 2016-07-11 DIAGNOSIS — Z9889 Other specified postprocedural states: Secondary | ICD-10-CM | POA: Diagnosis not present

## 2016-07-12 ENCOUNTER — Telehealth: Payer: Self-pay | Admitting: *Deleted

## 2016-07-12 NOTE — Telephone Encounter (Signed)
Patient informed. 

## 2016-07-12 NOTE — Telephone Encounter (Signed)
-----   Message from Sherren Mocha, MD sent at 07/11/2016  5:02 PM EST ----- Normal study post-PFO closure

## 2016-08-03 ENCOUNTER — Encounter: Payer: Self-pay | Admitting: Cardiovascular Disease

## 2016-08-03 ENCOUNTER — Encounter (INDEPENDENT_AMBULATORY_CARE_PROVIDER_SITE_OTHER): Payer: Self-pay

## 2016-08-03 ENCOUNTER — Ambulatory Visit (INDEPENDENT_AMBULATORY_CARE_PROVIDER_SITE_OTHER): Payer: 59 | Admitting: Cardiovascular Disease

## 2016-08-03 VITALS — BP 110/78 | HR 82 | Ht 65.0 in | Wt 190.6 lb

## 2016-08-03 DIAGNOSIS — Q211 Atrial septal defect: Secondary | ICD-10-CM | POA: Diagnosis not present

## 2016-08-03 DIAGNOSIS — Q2112 Patent foramen ovale: Secondary | ICD-10-CM

## 2016-08-03 NOTE — Progress Notes (Signed)
Cardiology Office Note Date:  08/03/2016   ID:  Morgan Shaffer, DOB Aug 08, 1970, MRN [id number]  PCP:  [name], MD  Cardiologist:  Sherren Mocha, MD    Chief Complaint  Patient presents with  . PFO    f/u echo bubble study     History of Present Illness: Morgan Shaffer is a 46 y.o. female who presents for follow-up evaluation.   She has a hx of PFO and cryptogenic stroke. She underwent transcatheter PFO closure in 0000000 without complications. A gore helex device was utilized. She is off of all antiplatelet Rx and has had no symptoms of recurrent stroke or TIA.  Follow-up echocardiogram was just recently done and demonstrated normal LV function with normal position of her atrial septal occluder device and no residual shunt based on agitated saline study.  The patient is here alone today. She has gained weight and complains of shortness of breath with activity. She's had some leg swelling. No orthopnea or PND. No chest pain or heart palpitations. When she has a stressful day at work, she has an aura but does not get headaches any longer.   Past Medical History:  Diagnosis Date  . Allergy    hay fever  . Asthma   . Chicken pox   . Dysmenorrhea   . History of TIAs    09/2009 and 02/2011; no residual deficit  . Iron deficiency anemia due to chronic blood loss   . Microcytic anemia 10/18/2011  . Migraine headache   . Mild tricuspid regurgitation   . Mitral regurgitation    mild  . PFO (patent foramen ovale)   . Refusal of blood transfusions as patient is Jehovah's Witness   . Shortness of breath   . Stroke [facility])    11/2008 with aphasia and left sided weakness, no residual deficit    Past Surgical History:  Procedure Laterality Date  . PATENT FORAMEN OVALE CLOSURE  02/02/12  . PATENT FORAMEN OVALE CLOSURE N/A 02/02/2012   Procedure: PATENT FORAMEN OVALE CLOSURE;  Surgeon: Sherren Mocha, MD;  Location: [facility] CATH LAB;  Service: Cardiovascular;  Laterality: N/A;      No current outpatient prescriptions on file.   No current facility-administered medications for this visit.     Allergies:   Aspirin; Lisinopril; and Penicillins   Social History:  The patient  reports that she has never smoked. She has never used smokeless tobacco. She reports that she drinks alcohol. She reports that she does not use drugs.   Family History:  The patient's  family history includes Alcohol abuse in her maternal grandfather and paternal grandmother; Arrhythmia in her mother; Arthritis in her maternal grandmother; Cancer in her maternal uncle and maternal uncle.    ROS:  Please see the history of present illness.   All other systems are reviewed and negative.    PHYSICAL EXAM: VS:  BP 110/78   Pulse 82   Ht 5\' 5"  (1.651 m)   Wt 190 lb 9.6 oz (86.5 kg)   BMI 31.72 kg/m  , BMI Body mass index is 31.72 kg/m. GEN: Well nourished, well developed, in no acute distress  HEENT: normal  Neck: no JVD, no masses. No carotid bruits Cardiac: RRR without murmur or gallop                Respiratory:  clear to auscultation bilaterally, normal work of breathing GI: soft, nontender, nondistended, + BS MS: no deformity or atrophy  Ext: no pretibial edema, pedal  pulses 2+= bilaterally Skin: warm and dry, no rash Neuro:  Strength and sensation are intact Psych: euthymic mood, full affect  EKG:  EKG is ordered today. The ekg ordered today shows normal sinus rhythm 82 bpm, within normal limits.  Recent Labs: 02/10/2016: BUN 10; Creatinine, Ser 0.88; Hemoglobin 10.2; Platelets 393; Potassium 3.7; Sodium 135   Lipid Panel     Component Value Date/Time   CHOL 167 03/23/2011 0606   TRIG 103 03/23/2011 0606   HDL 57 03/23/2011 0606   CHOLHDL 2.9 03/23/2011 0606   VLDL 21 03/23/2011 0606   LDLCALC 89 03/23/2011 0606      Wt Readings from Last 3 Encounters:  08/03/16 190 lb 9.6 oz (86.5 kg)  02/22/16 191 lb (86.6 kg)  02/10/16 195 lb (88.5 kg)     Cardiac Studies  Reviewed: 2D Echo 07-11-2016: Study Conclusions  - Procedure narrative: Transthoracic echocardiography for   postoperative evaluation. Image quality was adequate. Intravenous   contrast (agitated saline) was administered to identify shunting. - Left ventricle: The cavity size was normal. Wall thickness was   increased in a pattern of mild LVH. Systolic function was normal.   The estimated ejection fraction was in the range of 55% to 60%.   Wall motion was normal; there were no regional wall motion   abnormalities. - Mitral valve: Mildly thickened leaflets . There was mild   regurgitation. - Left atrium: The atrium was normal in size. - Atrial septum: The atrial septum is thickened and echogenic,   consistent with prior PFO closure. Bubble study was negative for   interatrial shunting. - Inferior vena cava: The vessel was normal in size. The   respirophasic diameter changes were in the normal range (= 50%),   consistent with normal central venous pressure.  Impressions:  - Limited study for cardiac shunt. Negative bubble study for PFO   with evidence of prior atrial septal device closure. LVEF 55-60%,   mild LVH, normal wall motion, mild MR, normal LA size.  ASSESSMENT AND PLAN: PFO status post transcatheter closure: The patient continues to do well. Her echocardiogram is reviewed and shows normal position of her PFO occluder device with no residual shunt. She will return in one year for follow-up.  Shortness of breath: Lengthy discussion about diet and exercise strategies. I think her shortness of breath is related to weight gain. Her echocardiogram is essentially normal. She has no history of smoking or lung disease and has an unremarkable physical exam.  Current medicines are reviewed with the patient today.  The patient does not have concerns regarding medicines.  Labs/ tests ordered today include:   Orders Placed This Encounter  Procedures  . EKG 12-Lead     Disposition:   FU one year  Signed, Sherren Mocha, MD  08/03/2016 5:44 PM    [address], [place], [place]  [address] Phone: [phone]; Fax: [phone]

## 2016-08-03 NOTE — Patient Instructions (Addendum)
Medication Instructions:  No medications.  Labwork: No new orders.  Testing/Procedures: No new orders.   Follow-Up: Your physician wants you to follow-up in: 1 YEAR with Dr Burt Knack.  You will receive a reminder letter in the mail two months in advance. If you don't receive a letter, please call our office to schedule the follow-up appointment.   Any Other Special Instructions Will Be Listed Below (If Applicable).     If you need a refill on your cardiac medications before your next appointment, please call your pharmacy.

## 2016-11-09 ENCOUNTER — Encounter (HOSPITAL_COMMUNITY): Payer: Self-pay | Admitting: *Deleted

## 2016-11-09 ENCOUNTER — Emergency Department (HOSPITAL_COMMUNITY): Payer: 59

## 2016-11-09 ENCOUNTER — Emergency Department (HOSPITAL_COMMUNITY)
Admission: EM | Admit: 2016-11-09 | Discharge: 2016-11-09 | Disposition: A | Discharge status: Home / Self Care | Payer: 59 | Attending: Emergency Medicine | Admitting: Emergency Medicine

## 2016-11-09 DIAGNOSIS — Z8673 Personal history of transient ischemic attack (TIA), and cerebral infarction without residual deficits: Secondary | ICD-10-CM | POA: Insufficient documentation

## 2016-11-09 DIAGNOSIS — R072 Precordial pain: Secondary | ICD-10-CM | POA: Insufficient documentation

## 2016-11-09 DIAGNOSIS — J45909 Unspecified asthma, uncomplicated: Secondary | ICD-10-CM | POA: Diagnosis not present

## 2016-11-09 DIAGNOSIS — R071 Chest pain on breathing: Secondary | ICD-10-CM | POA: Diagnosis present

## 2016-11-09 LAB — BASIC METABOLIC PANEL
Anion gap: 8 (ref 5–15)
BUN: 8 mg/dL (ref 6–20)
CHLORIDE: 106 mmol/L (ref 101–111)
CO2: 22 mmol/L (ref 22–32)
Calcium: 9.4 mg/dL (ref 8.9–10.3)
Creatinine, Ser: 0.87 mg/dL (ref 0.44–1.00)
GFR calc non Af Amer: >60 mL/min (ref >60)
Glucose, Bld: 106 mg/dL — ABNORMAL HIGH (ref 65–99)
POTASSIUM: 3.9 mmol/L (ref 3.5–5.1)
SODIUM: 136 mmol/L (ref 135–145)

## 2016-11-09 LAB — CBC
HEMATOCRIT: 31.2 % — AB (ref 36.0–46.0)
Hemoglobin: 9.2 g/dL — ABNORMAL LOW (ref 12.0–15.0)
MCH: 20.1 pg — AB (ref 26.0–34.0)
MCHC: 29.5 g/dL — ABNORMAL LOW (ref 30.0–36.0)
MCV: 68.1 fL — AB (ref 78.0–100.0)
Platelets: 450 10*3/uL — ABNORMAL HIGH (ref 150–400)
RBC: 4.58 MIL/uL (ref 3.87–5.11)
RDW: 17.4 % — ABNORMAL HIGH (ref 11.5–15.5)
WBC: 7 10*3/uL (ref 4.0–10.5)

## 2016-11-09 LAB — I-STAT TROPONIN, ED
TROPONIN I, POC: 0 ng/mL (ref 0.00–0.08)
Troponin i, poc: 0 ng/mL (ref 0.00–0.08)

## 2016-11-09 LAB — D-DIMER, QUANTITATIVE (NOT AT ARMC)

## 2016-11-09 MED ORDER — HYDROCODONE-ACETAMINOPHEN 5-325 MG PO TABS
1.0000 | ORAL_TABLET | Freq: Once | ORAL | Status: AC
  Administered 2016-11-09: 1 via ORAL
  Filled 2016-11-09: qty 1

## 2016-11-09 MED ORDER — CYCLOBENZAPRINE HCL 10 MG PO TABS
10.0000 mg | ORAL_TABLET | Freq: Once | ORAL | Status: AC
  Administered 2016-11-09: 10 mg via ORAL
  Filled 2016-11-09: qty 1

## 2016-11-09 MED ORDER — CYCLOBENZAPRINE HCL 10 MG PO TABS: 10.0000 mg | ORAL_TABLET | Freq: Two times a day (BID) | ORAL | 0 refills | Status: DC | PRN

## 2016-11-09 MED ORDER — HYDROCODONE-ACETAMINOPHEN 5-325 MG PO TABS: 1.0000 | ORAL_TABLET | ORAL | 0 refills | Status: DC | PRN

## 2016-11-09 NOTE — Discharge Instructions (Signed)
Your workup today did not show evidence of pneumonia, pulmonary embolism, or cardiac cause of your chest pain. Given the exam and symptoms, we suspect a musculoskeletal type pain. Please use the pain medicine and muscle relaxant to help treat your symptoms and please follow-up with your primary care physician in the next several days. If any symptoms change or worsen, please return to the nearest emergency department.

## 2016-11-09 NOTE — ED Provider Notes (Signed)
[facility] DEPT Provider Note   CSN: [id number] Arrival date & time: 11/09/16  1201  By signing my name below, I, Morgan Shaffer, attest that this documentation has been prepared under the direction and in the presence of Courtney Paris, MD. Electronically Signed: Ethelle Lyon Shaffer, Scribe. 11/09/2016. 2:01 PM.  History   Chief Complaint Chief Complaint  Patient presents with  . Chest Pain   The history is provided by the patient and medical records. No language interpreter was used.  Chest Pain   This is a new problem. The current episode started yesterday. The problem occurs constantly. The problem has been gradually improving. The pain is associated with breathing. The pain is present in the lateral region. The pain is at a severity of 9/10. The pain is severe. The quality of the pain is described as pleuritic and sharp. The pain radiates to the left shoulder and left arm. Pertinent negatives include no abdominal pain, no back pain, no cough, no diaphoresis, no dizziness, no fever, no headaches, no lower extremity edema, no nausea, no numbness, no palpitations, no shortness of breath, no syncope, no vomiting and no weakness. She has tried nothing for the symptoms.  Her past medical history is significant for strokes and TIA. Past medical history comments: hx of TIA from a "clot"    HPI Comments:  Morgan Shaffer is a 46 y.o. female with a PMhx of TIA's, Mitral Regurgitation, Asthma, CVA, PFO, and Iron Deficiency Anemia, who presents to the Emergency Department complaining of intermittent left-sided CP onset yesterday. She reports the sharp pain arising spontaneously last night while she was driving during a three hour road trip and was constant, presenting underneath her left breast. Today, the pain has been more intermittent and not as severe with associated, radiating left arm pain. This pain is exacerbated by deep inspiration and bending over. She has never had this type of pain  before today's episode. The CP yesterday was a 10/10, today it is a dull, 4/10 unless she bends over where it shoots to a sharp, 8/10. Pt has a h/o CVA and TIA which she was on anticoagulant or antiplatelet therapy for at that time but not currently. FHx of CHF paternal side. No alleviating factors noted. Pt denies SOB, leg pain, leg swelling, rhinorrhea, congestion, cough, sneezing, dysuria, hematuria, constipation, diarrhea, and any other complaints at this time.   Past Medical History:  Diagnosis Date  . Allergy    hay fever  . Asthma   . Chicken pox   . Dysmenorrhea   . History of TIAs    09/2009 and 02/2011; no residual deficit  . Iron deficiency anemia due to chronic blood loss   . Microcytic anemia 10/18/2011  . Migraine headache   . Mild tricuspid regurgitation   . Mitral regurgitation    mild  . PFO (patent foramen ovale)   . Refusal of blood transfusions as patient is Jehovah's Witness   . Shortness of breath   . Stroke [facility])    11/2008 with aphasia and left sided weakness, no residual deficit    Patient Active Problem List   Diagnosis Date Noted  . Herpes zoster 07/16/2013  . Dysmenorrhea   . Iron deficiency anemia due to chronic blood loss   . Microcytic anemia 10/18/2011  . PFO (patent foramen ovale) 11/24/2010    Past Surgical History:  Procedure Laterality Date  . PATENT FORAMEN OVALE CLOSURE  02/02/12  . PATENT FORAMEN OVALE CLOSURE N/A 02/02/2012  Procedure: PATENT FORAMEN OVALE CLOSURE;  Surgeon: Sherren Mocha, MD;  Location: [facility] CATH LAB;  Service: Cardiovascular;  Laterality: N/A;    OB History    No data available       Home Medications    Prior to Admission medications   Not on File    Family History Family History  Problem Relation Age of Onset  . Arrhythmia Mother   . Alcohol abuse Maternal Grandfather   . Cancer Maternal Uncle     Prostate Cancer  . Cancer Maternal Uncle     Prostate Cancer  . Hypertension      positive family hx of    . Alcohol abuse    . Arthritis Maternal Grandmother   . Alcohol abuse Paternal Grandmother     Social History Social History  Substance Use Topics  . Smoking status: Never Smoker  . Smokeless tobacco: Never Used  . Alcohol use Yes     Comment: ocassional     Allergies   Aspirin; Lisinopril; and Penicillins   Review of Systems Review of Systems  Constitutional: Negative for activity change, chills, diaphoresis, fatigue and fever.  HENT: Negative for congestion, rhinorrhea and sneezing.   Eyes: Negative for visual disturbance.  Respiratory: Negative for cough, chest tightness, shortness of breath and stridor.   Cardiovascular: Positive for chest pain. Negative for palpitations, leg swelling and syncope.  Gastrointestinal: Negative for abdominal distention, abdominal pain, constipation, diarrhea, nausea and vomiting.  Genitourinary: Negative for difficulty urinating, dysuria, flank pain, frequency, hematuria, menstrual problem, pelvic pain, vaginal bleeding and vaginal discharge.  Musculoskeletal: Positive for myalgias. Negative for back pain and neck pain.  Skin: Negative for rash and wound.  Neurological: Negative for dizziness, weakness, light-headedness, numbness and headaches.  Psychiatric/Behavioral: Negative for agitation and confusion.  All other systems reviewed and are negative.    Physical Exam Updated Vital Signs BP (!) 148/81 (BP Location: Left Arm)   Pulse 70   Temp 98.3 F (36.8 C) (Oral)   Resp 16   Ht 5\' 5"  (1.651 m)   Wt 190 lb (86.2 kg)   LMP 10/23/2016   SpO2 100%   BMI 31.62 kg/m   Physical Exam  Constitutional: She is oriented to person, place, and time. She appears well-developed and well-nourished. No distress.  HENT:  Head: Normocephalic and atraumatic.  Right Ear: External ear normal.  Left Ear: External ear normal.  Nose: Nose normal.  Mouth/Throat: Oropharynx is clear and moist. No oropharyngeal exudate.  Eyes: Conjunctivae and EOM  are normal. Pupils are equal, round, and reactive to light.  Neck: Normal range of motion. Neck supple.  Cardiovascular: Normal rate and normal heart sounds.  Exam reveals no gallop.   No murmur heard. No BLE Edema.  Pulmonary/Chest: Effort normal and breath sounds normal. No stridor. No respiratory distress. She has no wheezes. She has no rales. She exhibits no tenderness.  Lungs clear to ausculatation bialterally. Chest nontender.  Abdominal: Soft. She exhibits no distension. There is no tenderness. There is no rebound.  Neurological: She is alert and oriented to person, place, and time. She has normal reflexes. She exhibits normal muscle tone. Coordination normal.  Skin: Skin is warm. No rash noted. She is not diaphoretic. No erythema.  Nursing note and vitals reviewed.    ED Treatments / Results  DIAGNOSTIC STUDIES:  Oxygen Saturation is 99% on RA, normal by my interpretation.    COORDINATION OF CARE:  2:01 PM Discussed treatment plan with pt at bedside including  blood work, CXR, EKG, with a D-Dimer, and pt agreed to plan. Pt is driving herself home from the ED but states her daughter can pick her up if needed.   4:07 PM Pt re-evaluated with results of imaging and labs discussed.   Labs (all labs ordered are listed, but only abnormal results are displayed) Labs Reviewed  BASIC METABOLIC PANEL - Abnormal; Notable for the following:       Result Value   Glucose, Bld 106 (*)    All other components within normal limits  CBC - Abnormal; Notable for the following:    Hemoglobin 9.2 (*)    HCT 31.2 (*)    MCV 68.1 (*)    MCH 20.1 (*)    MCHC 29.5 (*)    RDW 17.4 (*)    Platelets 450 (*)    All other components within normal limits  D-DIMER, QUANTITATIVE (NOT AT [facility])  Randolm Idol, ED  I-STAT TROPOININ, ED    EKG  EKG Interpretation  Date/Time:  Wednesday November 09 2016 12:07:11 EDT Ventricular Rate:  103 PR Interval:  124 QRS Duration: 84 QT Interval:  324 QTC  Calculation: 424 R Axis:   13 Text Interpretation:  Sinus tachycardia Otherwise normal ECG when comapred to prior, now tachycardia. No STEMI Confirmed by Sherry Ruffing MD, Zena [phone]) on 11/09/2016 1:32:48 PM       Radiology Dg Chest 2 View  Result Date: 11/09/2016 CLINICAL DATA:  Left breast pain. EXAM: CHEST  2 VIEW COMPARISON:  02/03/2012. FINDINGS: Mediastinum and hilar structures normal. Heart size normal. Prior foramen ovale closure. Lungs are clear. No pleural effusion or pneumothorax. IMPRESSION: 1. No acute cardiopulmonary disease. 2. Prior foramen ovale closure. Electronically Signed   By: Marcello Moores  Register   On: 11/09/2016 13:02    Procedures Procedures (including critical care time)  Medications Ordered in ED Medications  HYDROcodone-acetaminophen (NORCO/VICODIN) 5-325 MG per tablet 1 tablet (1 tablet Oral Given 11/09/16 1418)  cyclobenzaprine (FLEXERIL) tablet 10 mg (10 mg Oral Given 11/09/16 1418)     Initial Impression / Assessment and Plan / ED Course  I have reviewed the triage vital signs and the nursing notes.  Pertinent labs & imaging results that were available during my care of the patient were reviewed by me and considered in my medical decision making (see chart for details).     Morgan Shaffer is a 46 y.o. female with a PMhx of TIA's, Mitral Regurgitation, Asthma, CVA, PFO, and Iron Deficiency Anemia, who presents to the Emergency Department complaining of intermittent left-sided CP onset yesterday. Patient reports her sharp pleuritic chest pain began yesterday during a Shaff[place] car drive. Patient reports her symptoms have been continuous but improving since that time. Pain radiates into her left arm.  Patient also reports a history of "clotting" that in conjunction with PFO caused her to have her strokes. Patient says she took anticoagulation for a period of time but is no longer on anticoagulation. She denies any history of DVT to On exam, patiare clear. Patient  chest is nontender. Patient's abdomen is nontender. No significant lower extremity edema present.  Based on patient's description of pleuritic chest pain, sudden onset, relationship to driving, and history of clotting, d-dimer was added. As it is worsened with movement, deep breathing, and twisting, musculoskeletal etiology is also strongly considered.  Patient will have lab testing, imaging, and EKG.  EKG showed no signs of acute ischemia. Sinus tachycardia. Chest x-ray was unremarkable and PFO repair was seen.  Patient was given pain medicine and muscle relaxant given the likely muscular skeletal nature during initial evaluation.  Anticipate reassessment following lab testing.  Patient has significant improvement in her chest pain after medications and reassessment. Patient's delta troponin was negative. D-dimer not elevated. Laboratory testing otherwise unremarkable and hemoglobin was slightly decreased from prior.   Given patient's reassuring workup, suspect musculoskeletal chest pain. Patient was however encouraged to follow-up with her PCP in the next 24-48 hours. Patient was also given strict return precautions for any new or worsening symptoms. Patient was given prescription for pain medicine and muscle relaxant to help with her likely musculoskeletal chest pain. Patient voiced understanding of the plan of care and at other questions or concerns. Patient discharged in good condition with significant improvement in presenting symptoms.   Final Clinical Impressions(s) / ED Diagnoses   Final diagnoses:  Precordial chest pain    New Prescriptions New Prescriptions   CYCLOBENZAPRINE (FLEXERIL) 10 MG TABLET    Take 1 tablet (10 mg total) by mouth 2 (two) times daily as needed for muscle spasms.   HYDROCODONE-ACETAMINOPHEN (NORCO/VICODIN) 5-325 MG TABLET    Take 1 tablet by mouth every 4 (four) hours as needed.    I personally performed the services described in this documentation,  which was scribed in my presence. The recorded information has been reviewed and is accurate.   Clinical Impression: 1. Precordial chest pain     Disposition: Discharge  Condition: Good  I have discussed the results, Dx and Tx plan with the pt(& family if present). He/she/they expressed understanding and agree(s) with the plan. Discharge instructions discussed at great length. Strict return precautions discussed and pt &/or family have verbalized understanding of the instructions. No further questions at time of discharge.    New Prescriptions   CYCLOBENZAPRINE (FLEXERIL) 10 MG TABLET    Take 1 tablet (10 mg total) by mouth 2 (two) times daily as needed for muscle spasms.   HYDROCODONE-ACETAMINOPHEN (NORCO/VICODIN) 5-325 MG TABLET    Take 1 tablet by mouth every 4 (four) hours as needed.    Follow Up: Marletta Lor, MD [place] [address] [phone]  Schedule an appointment as soon as possible for a visit    [place] [address] [id number] [place] [phone]  If symptoms worsen        Courtney Paris, MD 11/09/16 [phone]

## 2016-11-09 NOTE — ED Triage Notes (Signed)
Pt states had a 3 hour drive yesterday and developed left sided chest pain and it has continued today and now left arm is hurting.  Pt states now the chest pain only hurts with deep breath or bending over. No shortness of breath or calf pain

## 2017-09-30 IMAGING — DX DG CHEST 2V
2 series · 2 of 2 positions shown · non-contrast
Comparison: 02/03/2012.

CLINICAL DATA: Left breast pain.

EXAM:
CHEST  2 VIEW

[chest pa]
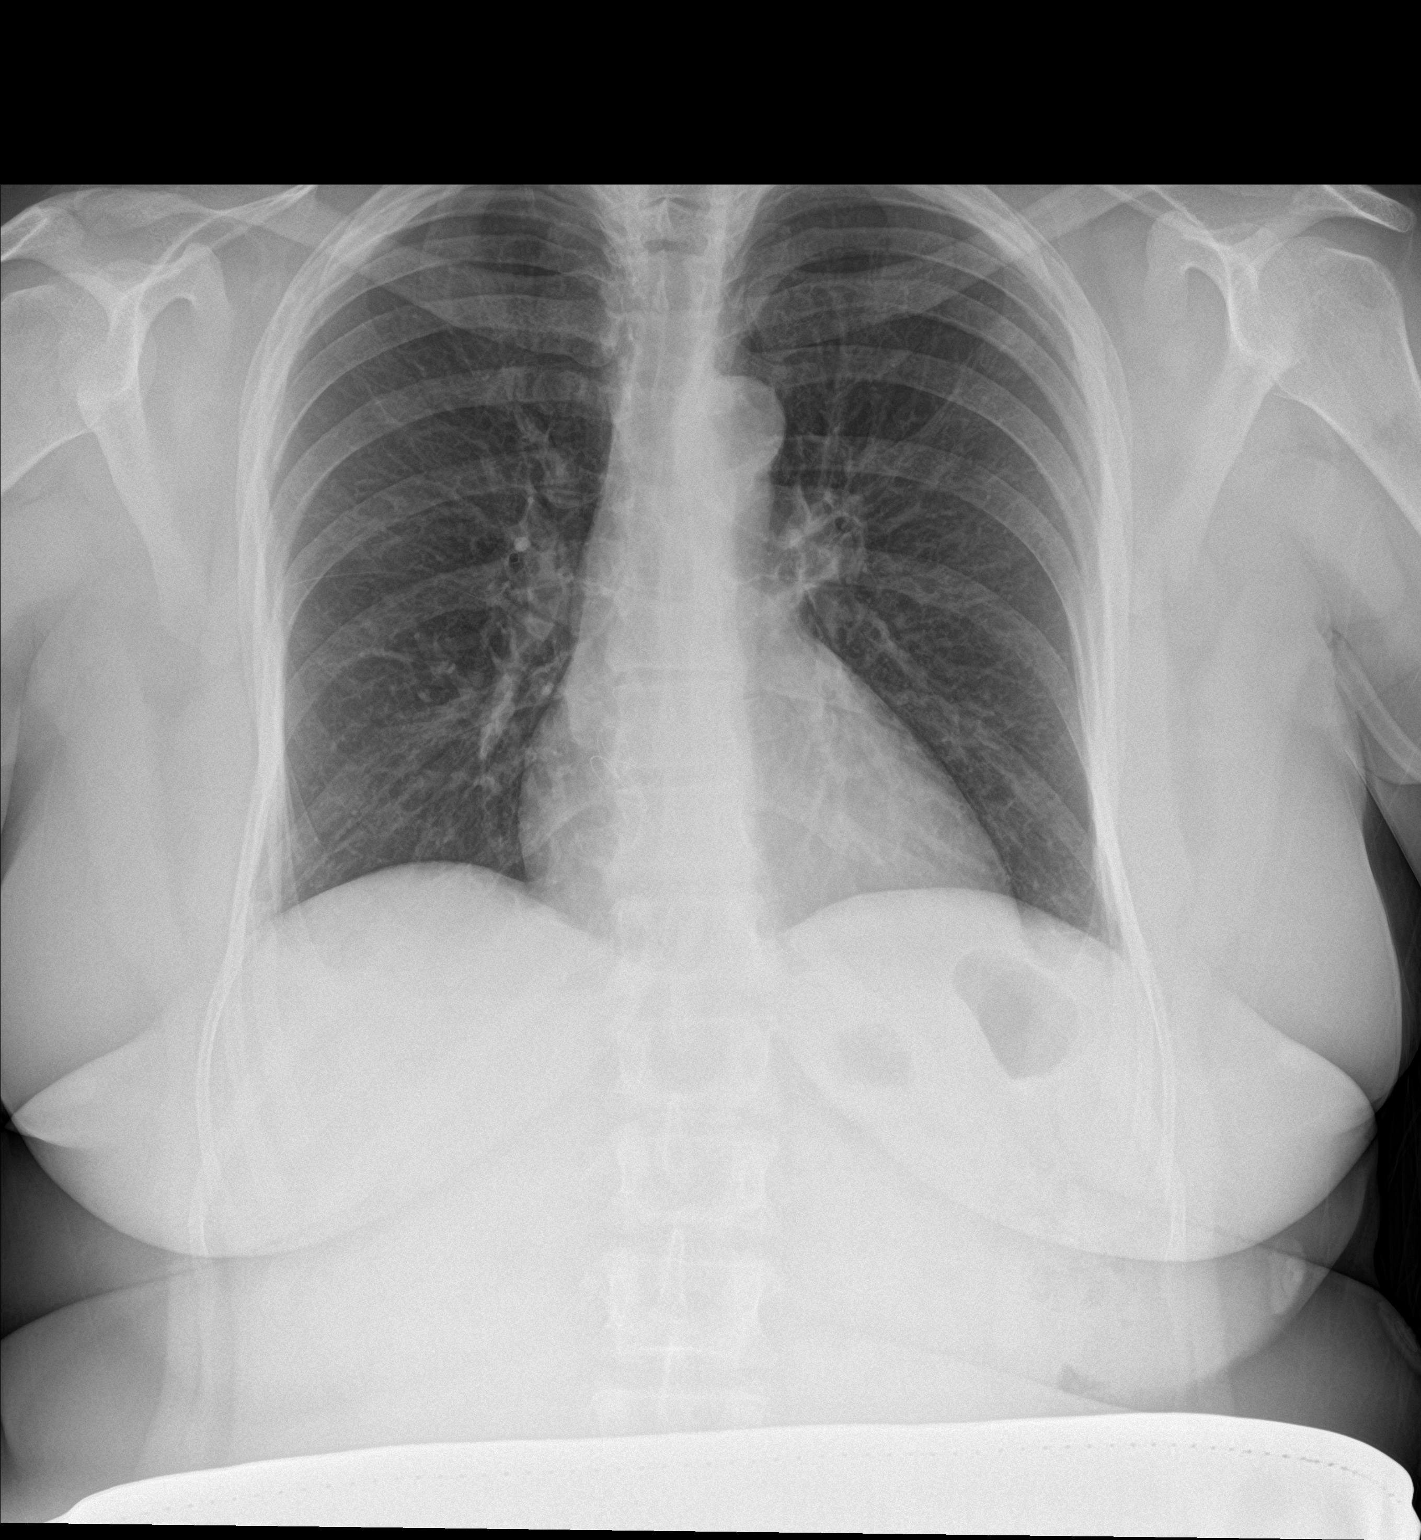

[chest lat]
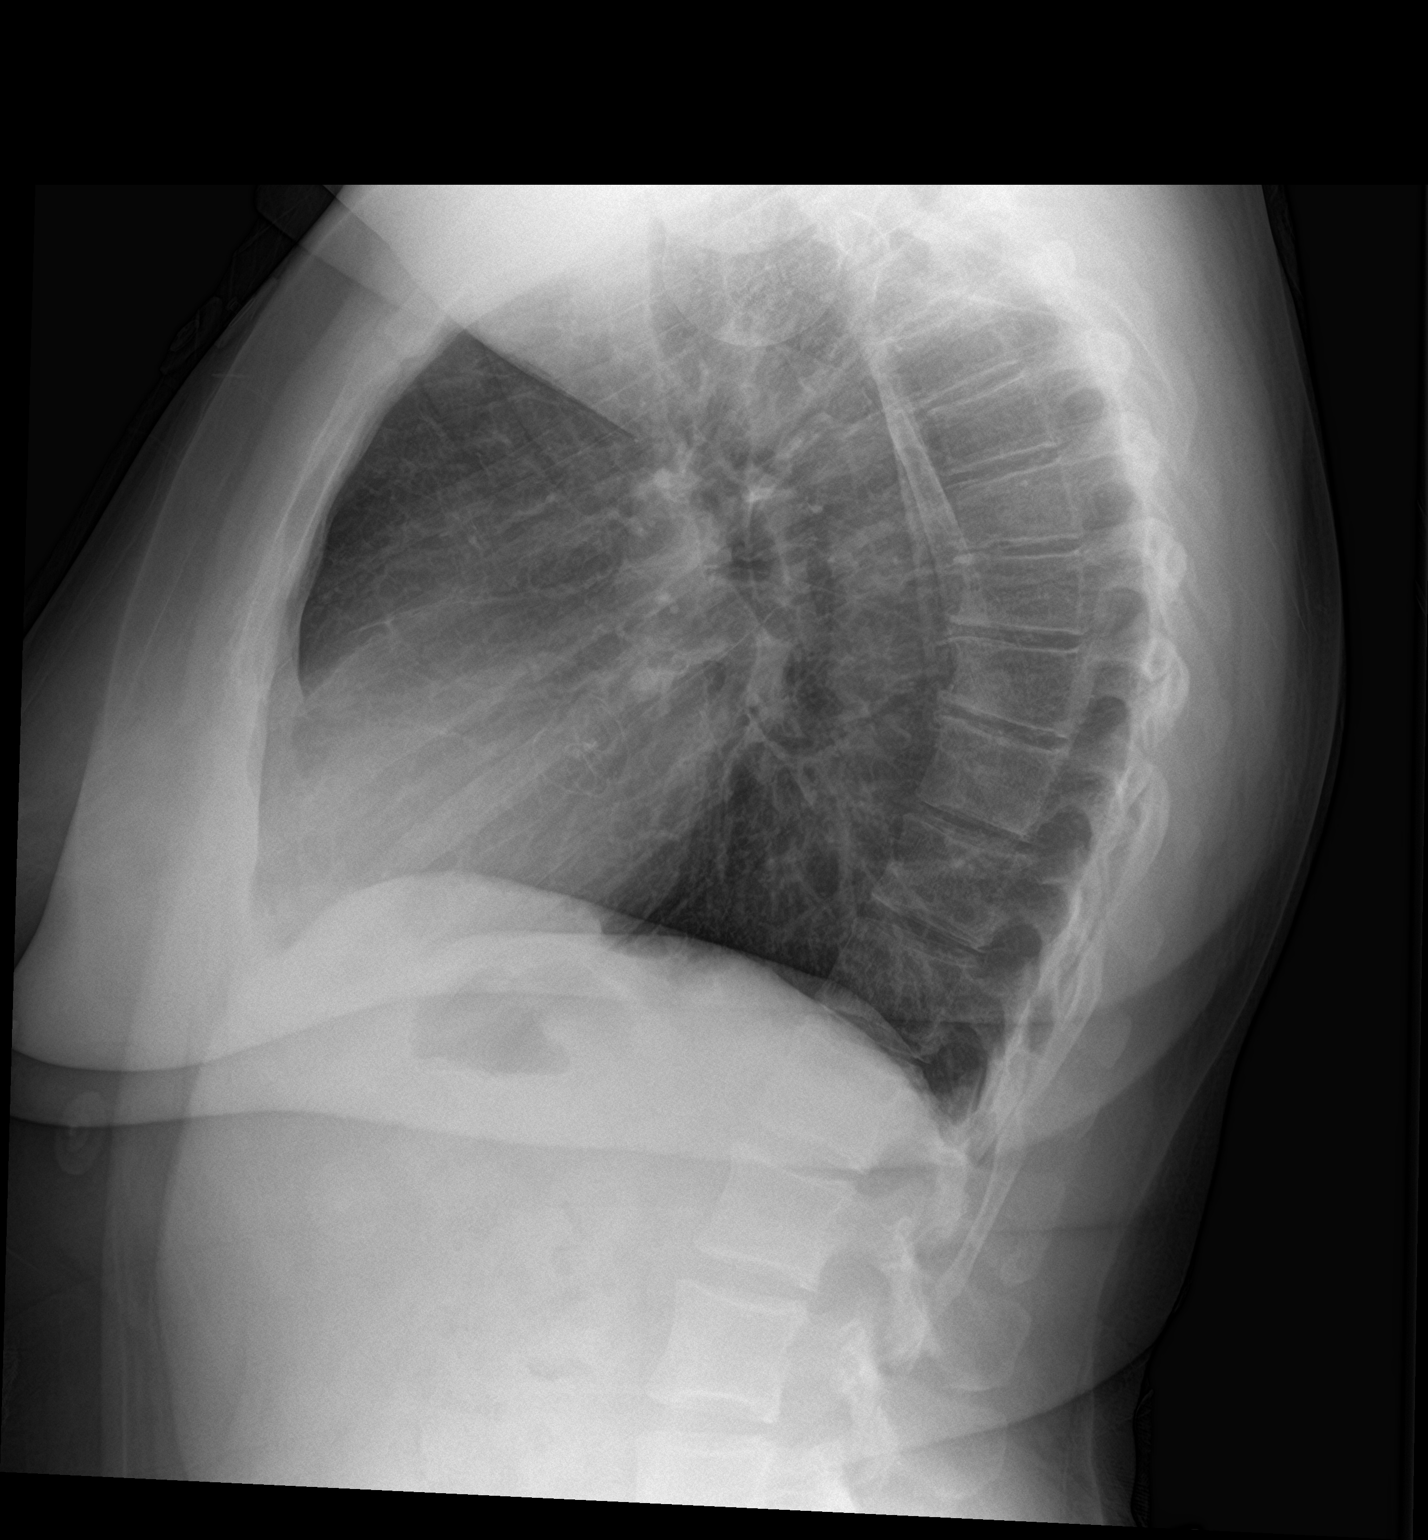

[2 of 2 positions shown; findings below may reference images not displayed]

FINDINGS: Mediastinum and hilar structures normal. Heart size normal. Prior
foramen ovale closure. Lungs are clear. No pleural effusion or
pneumothorax.
IMPRESSION: 1. No acute cardiopulmonary disease.

2. Prior foramen ovale closure.

## 2018-08-03 ENCOUNTER — Encounter: Payer: 59 | Admitting: Family Medicine

## 2018-08-03 DIAGNOSIS — Z0289 Encounter for other administrative examinations: Secondary | ICD-10-CM

## 2018-08-13 ENCOUNTER — Ambulatory Visit: Payer: 59 | Admitting: Family Medicine

## 2018-08-13 DIAGNOSIS — Z0289 Encounter for other administrative examinations: Secondary | ICD-10-CM

## 2019-10-03 ENCOUNTER — Encounter: Payer: Self-pay | Admitting: Cardiovascular Disease

## 2019-10-06 NOTE — Progress Notes (Signed)
Virtual Visit via Telephone Note   This visit type was conducted due to national recommendations for restrictions regarding the COVID-19 Pandemic (e.g. social distancing) in an effort to limit this patient's exposure and mitigate transmission in our community.  Due to her co-morbid illnesses, this patient is at least at moderate risk for complications without adequate follow up.  This format is felt to be most appropriate for this patient at this time.  The patient did not have access to video technology/had technical difficulties with video requiring transitioning to audio format only (telephone).  All issues noted in this document were discussed and addressed.  No physical exam could be performed with this format.  Please refer to the patient's chart for her  consent to telehealth for [facility].   The patient was identified using 2 identifiers.  Date:  10/07/2019   ID:  Morgan Shaffer, DOB 04/23/1971, MRN [id number]  Patient Location: Home Provider Location: Office  PCP:  Marletta Lor, MD  Cardiologist:  Sherren Mocha, MD   Electrophysiologist:  None   Evaluation Performed:  Follow-Up Visit  Chief Complaint: History of PFO closure  Patient profile:    Morgan Shaffer is a 49 y.o. female with   Patent Foramen Ovale  S/p Cryptogenic CVA  S/p transcatheter closure in 2013  Echocardiogram 3/16: EF 60-65, no residual shunt across closure device  Echocardiogram w/ bubble study 12/17: EF 55-60, neg bubble study  Prior Cardiac Studies  Echocardiogram w/ Bubble Study 07/11/16 Mild LVH, EF 55-60, no RWMA, mild MR, neg bubble study  Echocardiogram 10/09/14 EF 60-65, no RWMA, trivial MR, s/p closure w/o residual shunt, trivial MR, PASP 28  History of Present Illness Today, she notes that she has been doing fairly well since she was last seen.  She does have occasional episodes of chest discomfort.  Her chest pain has been going on for about 2 years without change.   The chest pain is somewhat random.  They do not seem to occur with exertion.  She may have chest discomfort and not have it again for several months.  She has not really been able to related to any certain aggravating factors.  She has not been able to take any medication to improve her symptoms.  They last about 5 to 10 minutes.  She has had occasional jaw pain.  She has not had any shortness of breath, nausea but has been diaphoretic.  She has been able to exert herself without symptoms.  She has not been active since COVID-19 started.  She does get short of breath with some activities but this seems to be related to deconditioning.  She has not had syncope, orthopnea, PND.  She has mild pedal edema.  This seems to be related to prolonged standing.   Past Medical History:  Diagnosis Date  . Allergy    hay fever  . Asthma   . Chicken pox   . Dysmenorrhea   . History of TIAs    09/2009 and 02/2011; no residual deficit  . Iron deficiency anemia due to chronic blood loss   . Microcytic anemia 10/18/2011  . Migraine headache   . Mild tricuspid regurgitation   . Mitral regurgitation    mild  . PFO (patent foramen ovale)    s/p closure in 2013  . Refusal of blood transfusions as patient is Jehovah's Witness   . Shortness of breath   . Stroke [facility])    11/2008 with aphasia and left sided  weakness, no residual deficit   Past Surgical History:  Procedure Laterality Date  . PATENT FORAMEN OVALE CLOSURE  02/02/12  . PATENT FORAMEN OVALE CLOSURE N/A 02/02/2012   Procedure: PATENT FORAMEN OVALE CLOSURE;  Surgeon: Sherren Mocha, MD;  Location: [facility] CATH LAB;  Service: Cardiovascular;  Laterality: N/A;     Current Meds  Medication Sig  . cyclobenzaprine (FLEXERIL) 10 MG tablet Take 1 tablet (10 mg total) by mouth 2 (two) times daily as needed for muscle spasms.  Marland Kitchen HYDROcodone-acetaminophen (NORCO/VICODIN) 5-325 MG tablet Take 1 tablet by mouth every 4 (four) hours as needed.     Allergies:   Aspirin,  Lisinopril, and Penicillins   Social History   Tobacco Use  . Smoking status: Never Smoker  . Smokeless tobacco: Never Used  Substance Use Topics  . Alcohol use: Yes    Comment: ocassional  . Drug use: No     Family Hx: The patient's family history includes Alcohol abuse in her maternal grandfather, paternal grandmother, and unknown relative; Arrhythmia in her mother; Arthritis in her maternal grandmother; Cancer in her maternal uncle and maternal uncle; Hypertension in her unknown relative.  ROS:   Please see the history of present illness.     Labs/Other Tests and Data Reviewed:    EKG:  No ECG reviewed.  Recent Labs: No results found for requested labs within last 8760 hours.   Recent Lipid Panel Lab Results  Component Value Date/Time   CHOL 167 03/23/2011 06:06 AM   TRIG 103 03/23/2011 06:06 AM   HDL 57 03/23/2011 06:06 AM   CHOLHDL 2.9 03/23/2011 06:06 AM   LDLCALC 89 03/23/2011 06:06 AM    Wt Readings from Last 3 Encounters:  10/07/19 180 lb (81.6 kg)  11/09/16 190 lb (86.2 kg)  08/03/16 190 lb 9.6 oz (86.5 kg)     Objective:    Vital Signs:  Ht 5\' 5"  (1.651 m)   Wt 180 lb (81.6 kg)   BMI 29.95 kg/m    VITAL SIGNS:  reviewed GEN:  no acute distress RESPIRATORY:  No labored breathing noted NEURO:  Alert and oriented PSYCH:  normal affect  ASSESSMENT & PLAN:    1. PFO (patent foramen ovale) Status post closure in 2013.  Bubble study in 2017 was negative.  She has had normal ejection fraction.  She has had some chest discomfort over the past couple of years.  This is fairly stable without any change and is not related to exertion.  She also has some exertional shortness of breath that seems to be related to deconditioning.  She has no symptoms consistent with volume excess.  Since it has been several years since her last echocardiogram, I have recommended that we repeat this study to reassess her closure device.  I will also arrange follow-up with Dr.  Burt Knack in 6 months, in person.  2. Precordial chest pain Her chest symptoms are atypical for ischemia.  It is reassuring that her symptoms have been ongoing for 2 years without significant change in her not related to exertion.  Therefore, I am not convinced that her chest discomfort is related to ischemic heart disease.  I have recommended that she use over-the-counter Pepcid for couple weeks to see if this helps or take as needed antiacids at the time of her symptoms.  I have also suggested that she should follow up with primary care to assess for other causes.  As noted, I will bring her back in follow-up in the office  in the next 6 months.  I have advised her to contact her office for earlier follow-up if she has any change in her symptoms.   Time:   Today, I have spent 12.5 minutes with the patient with telehealth technology discussing the above problems.     Medication Adjustments/Labs and Tests Ordered: Current medicines are reviewed at length with the patient today.  Concerns regarding medicines are outlined above.   Tests Ordered: Orders Placed This Encounter  Procedures  . ECHOCARDIOGRAM COMPLETE    Medication Changes: No orders of the defined types were placed in this encounter.   Follow Up:  In Person in 6 month(s)  Signed, Richardson Dopp, PA-C  10/07/2019 9:46 AM    [facility]

## 2019-10-07 ENCOUNTER — Encounter: Payer: Self-pay | Admitting: Physician Assistant

## 2019-10-07 ENCOUNTER — Other Ambulatory Visit: Payer: Self-pay

## 2019-10-07 ENCOUNTER — Telehealth (INDEPENDENT_AMBULATORY_CARE_PROVIDER_SITE_OTHER): Payer: 59 | Admitting: Physician Assistant

## 2019-10-07 VITALS — Ht 65.0 in | Wt 180.0 lb

## 2019-10-07 DIAGNOSIS — R072 Precordial pain: Secondary | ICD-10-CM

## 2019-10-07 DIAGNOSIS — Q211 Atrial septal defect: Secondary | ICD-10-CM

## 2019-10-07 DIAGNOSIS — Q2112 Patent foramen ovale: Secondary | ICD-10-CM

## 2019-10-07 NOTE — Patient Instructions (Signed)
Medication Instructions:   Your physician recommends that you continue on your current medications as directed. Please refer to the Current Medication list given to you today.  *If you need a refill on your cardiac medications before your next appointment, please call your pharmacy*  Lab Work:  None ordered today  If you have labs (blood work) drawn today and your tests are completely normal, you will receive your results only by: Marland Kitchen MyChart Message (if you have MyChart) OR . A paper copy in the mail If you have any lab test that is abnormal or we need to change your treatment, we will call you to review the results.  Testing/Procedures:  Your physician has requested that you have an echocardiogram. Echocardiography is a painless test that uses sound waves to create images of your heart. It provides your doctor with information about the size and shape of your heart and how well your heart's chambers and valves are working. This procedure takes approximately one hour. There are no restrictions for this procedure.  Follow-Up: At [facility], you and your health needs are our priority.  As part of our continuing mission to provide you with exceptional heart care, we have created designated Provider Care Teams.  These Care Teams include your primary Cardiologist (physician) and Advanced Practice Providers (APPs -  Physician Assistants and Nurse Practitioners) who all work together to provide you with the care you need, when you need it.  We recommend signing up for the patient portal called "MyChart".  Sign up information is provided on this After Visit Summary.  MyChart is used to connect with patients for Virtual Visits (Telemedicine).  Patients are able to view lab/test results, encounter notes, upcoming appointments, etc.  Non-urgent messages can be sent to your provider as well.   To learn more about what you can do with MyChart, go to [email or web address].    Your next appointment:    6 month(s)  The format for your next appointment:   In Person  Provider:   Sherren Mocha, MD

## 2019-10-24 ENCOUNTER — Other Ambulatory Visit: Payer: Self-pay

## 2019-10-24 ENCOUNTER — Ambulatory Visit (HOSPITAL_COMMUNITY): Payer: 59 | Attending: Internal Medicine

## 2019-10-24 DIAGNOSIS — Q211 Atrial septal defect: Secondary | ICD-10-CM

## 2019-10-24 DIAGNOSIS — R072 Precordial pain: Secondary | ICD-10-CM | POA: Diagnosis present

## 2019-10-24 DIAGNOSIS — Q2112 Patent foramen ovale: Secondary | ICD-10-CM

## 2019-10-25 ENCOUNTER — Encounter: Payer: Self-pay | Admitting: Physician Assistant

## 2019-10-30 ENCOUNTER — Telehealth: Payer: Self-pay

## 2019-10-30 DIAGNOSIS — Q238 Other congenital malformations of aortic and mitral valves: Secondary | ICD-10-CM

## 2019-10-30 DIAGNOSIS — I359 Nonrheumatic aortic valve disorder, unspecified: Secondary | ICD-10-CM

## 2019-10-30 NOTE — Telephone Encounter (Signed)
Richardson Dopp T, PA-C  10/29/2019 9:05 PM EDT    As noted. Study reviewed with Dr. Burt Knack and Dr. Sallyanne Kuster. There is a small mobile density at the base of the non- and left- commisure of the Ao valve. Plan is to repeat echocardiogram to follow this (will get a repeat study in 6 mos).  Richardson Dopp, PA-C   10/29/2019 9:05 PM     Repeat echo ordered to be scheduled in 6 months. The patient understands she will be called to arrange both echo and visit with Dr. Burt Knack when the schedule is available.

## 2020-04-24 ENCOUNTER — Other Ambulatory Visit (HOSPITAL_COMMUNITY): Payer: 59

## 2020-04-27 ENCOUNTER — Ambulatory Visit: Payer: 59 | Admitting: Cardiovascular Disease

## 2020-06-10 ENCOUNTER — Other Ambulatory Visit: Payer: Self-pay

## 2020-06-10 ENCOUNTER — Ambulatory Visit (HOSPITAL_COMMUNITY): Payer: 59 | Attending: Internal Medicine

## 2020-06-10 DIAGNOSIS — Q238 Other congenital malformations of aortic and mitral valves: Secondary | ICD-10-CM | POA: Insufficient documentation

## 2020-06-10 LAB — ECHOCARDIOGRAM COMPLETE
Area-P 1/2: 3.17 cm2
S' Lateral: 3.9 cm

## 2020-08-03 ENCOUNTER — Ambulatory Visit: Payer: 59 | Admitting: Cardiovascular Disease

## 2021-10-25 ENCOUNTER — Ambulatory Visit: Payer: 59 | Admitting: Cardiovascular Disease

## 2021-10-25 ENCOUNTER — Encounter: Payer: Self-pay | Admitting: Cardiovascular Disease

## 2021-10-25 VITALS — BP 124/84 | HR 68 | Ht 65.0 in | Wt 168.4 lb

## 2021-10-25 DIAGNOSIS — Q2112 Patent foramen ovale: Secondary | ICD-10-CM

## 2021-10-25 DIAGNOSIS — D151 Benign neoplasm of heart: Secondary | ICD-10-CM | POA: Diagnosis not present

## 2021-10-25 NOTE — Progress Notes (Signed)
?Cardiology Office Note:   ? ?Date:  10/25/2021  ? ?ID:  Morgan Shaffer, DOB 06-Apr-1971, MRN [id number] ? ?PCP:  Marletta Lor, MD (Inactive) ?  ?[facility] Providers ?Cardiologist:  Sherren Mocha, MD    ? ?Referring MD: No ref. provider found  ? ?Chief Complaint  ?Patient presents with  ? Follow-up  ?  PFO  ? ? ?History of Present Illness:   ? ?Morgan Shaffer is a 51 y.o. female with a hx of: ?Patent Foramen Ovale ?S/p Cryptogenic CVA ?S/p transcatheter closure in 2013 ?Echocardiogram 3/16: EF 60-65, no residual shunt across closure device ?Echocardiogram w/ bubble study 12/17: EF 55-60, neg bubble study ? 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 ?The patient is here alone today.  She is doing well from a cardiac perspective. Today, she denies symptoms of palpitations, chest pain, shortness of breath, orthopnea, PND, lower extremity edema, dizziness, or syncope.  She is doing well has no complaints today.  Her daughter is now living in [place] and her son is a Paramedic at [employer]. ? ? ?Past Medical History:  ?Diagnosis Date  ? Allergy   ? hay fever  ? Asthma   ? Chicken pox   ? Dysmenorrhea   ? History of TIAs   ? 09/2009 and 02/2011; no residual deficit  ? Iron deficiency anemia due to chronic blood loss   ? Microcytic anemia 10/18/2011  ? Migraine headache   ? Mild tricuspid regurgitation   ? Mitral regurgitation   ? mild  ? PFO (patent foramen ovale)   ? s/p closure in 2013 // Echocardiogram 10/2019:  EF 60-65, no RWMA, Gr 1 DD, GLS -21.1%, RVSP 21.8, mild LAE, s/p Patent Foramen Ovale closure w/o residual shunt, mild MR, ? Small mobile mass AoV (cannot r/o papillary fibroelastoma)   ? Refusal of blood transfusions as patient is Jehovah's Witness   ? Shortness of breath   ? Stroke [facility])   ? 11/2008 with aphasia and left sided weakness, no residual deficit  ? ? ?Past Surgical History:  ?Procedure Laterality Date  ? PATENT FORAMEN  OVALE CLOSURE  02/02/12  ? PATENT FORAMEN OVALE CLOSURE N/A 02/02/2012  ? Procedure: PATENT FORAMEN OVALE CLOSURE;  Surgeon: Sherren Mocha, MD;  [facility] C[facility] LAB;  Service: Cardiovascular;  Laterality: N/A;  ? ? ?Current Medications: ?No outpatient medications have been marked as taking for the 10/25/21 encounter (Office Visit) with Sherren Mocha, MD.  ?  ? ?Allergies:   Aspirin, Lisinopril, and Penicillins  ? ?Social History  ? ?Socioeconomic History  ? Marital status: Single  ?  Spouse name: Not on file  ? Number of children: 2  ? Years of education: Not on file  ? Highest education level: Not on file  ?Occupational History  ?  Comment:   She is Jehovah Witness.  ?  Employer: [employer]  ?  Comment: Finance industry  ?Tobacco Use  ? Smoking status: Never  ? Smokeless tobacco: Never  ?Substance and Sexual Activity  ? Alcohol use: Yes  ?  Comment: ocassional  ? Drug use: No  ? Sexual activity: Never  ?  Birth control/protection: None  ?Other Topics Concern  ? Not on file  ?Social History Narrative  ? Not on file  ? ?Social Determinants of Health  ? ?Financial Resource Strain: Not on file  ?Food Insecurity: Not on file  ?Transportation Needs: Not on file  ?Physical Activity: Not  on file  ?Stress: Not on file  ?Social Connections: Not on file  ?  ? ?Family History: ?The patient's family history includes Alcohol abuse in her maternal grandfather, paternal grandmother, and unknown relative; Arrhythmia in her mother; Arthritis in her maternal grandmother; Cancer in her maternal uncle and maternal uncle; Hypertension in her unknown relative. ? ?ROS:   ?Please see the history of present illness.    ?All other systems reviewed and are negative. ? ?EKGs/Labs/Other Studies Reviewed:   ? ?The following studies were reviewed today: ?Echo 06/10/2020: ? 1. Left ventricular ejection fraction, by estimation, is 60 to 65%. The  ?left ventricle has normal function. The left ventricle has no regional  ?wall motion abnormalities. Left  ventricular diastolic parameters are  ?consistent with Grade I diastolic  ?dysfunction (impaired relaxation). The average left ventricular global  ?longitudinal strain is -24.8 %. The global longitudinal strain is normal.  ? 2. Right ventricular systolic function is normal. The right ventricular  ?size is normal. There is normal pulmonary artery systolic pressure. The  ?estimated right ventricular systolic pressure is 01.6 mmHg.  ? 3. S/p PFO closure on 02/02/2012 with a 20 mm Gore Helix closure device.  ?Bubble study not performed. No residual shunt by color doppler.  ? 4. The mitral valve is abnormal. Mild mitral valve regurgitation.  ? 5. The aortic valve is tricuspid. Aortic valve regurgitation is not  ?visualized. Stable small mobile mass at the commisure of the non and left  ?coronary cusps.  ? 6. The inferior vena cava is normal in size with greater than 50%  ?respiratory variability, suggesting right atrial pressure of 3 mmHg.  ? ?Comparison(s): No significant change from prior study. 10/24/19 EF 60-65%.  ?GLS -21.1%, small mobile mass at the commisure of the non and  ?right-coronary cusp.  ? ?EKG:  EKG is ordered today.  The ekg ordered today demonstrates normal sinus rhythm 68 bpm, within normal limits ? ?Recent Labs: ?No results found for requested labs within last 8760 hours.  ?Recent Lipid Panel ?   ?Component Value Date/Time  ? CHOL 167 03/23/2011 0606  ? TRIG 103 03/23/2011 0606  ? HDL 57 03/23/2011 0606  ? CHOLHDL 2.9 03/23/2011 0606  ? VLDL 21 03/23/2011 0606  ? [place] 89 03/23/2011 0606  ? ? ? ?Risk Assessment/Calculations:   ?  ? ?    ? ?Physical Exam:   ? ?VS:  BP 124/84   Pulse 68   Ht '5\' 5"'$  (1.651 m)   Wt 168 lb 6.4 oz (76.4 kg)   SpO2 98%   BMI 28.02 kg/m?    ? ?Wt Readings from Last 3 Encounters:  ?10/25/21 168 lb 6.4 oz (76.4 kg)  ?10/07/19 180 lb (81.6 kg)  ?11/09/16 190 lb (86.2 kg)  ?  ? ?GEN: Well nourished, well developed in no acute distress ?HEENT: Normal ?NECK: No JVD; No carotid  bruits ?LYMPHATICS: No lymphadenopathy ?CARDIAC: RRR, no murmurs, rubs, gallops ?RESPIRATORY:  Clear to auscultation without rales, wheezing or rhonchi  ?ABDOMEN: Soft, non-tender, non-distended ?MUSCULOSKELETAL:  No edema; No deformity  ?SKIN: Warm and dry ?NEUROLOGIC:  Alert and oriented x 3 ?PSYCHIATRIC:  Normal affect  ? ?ASSESSMENT:   ? ?1. PFO (patent foramen ovale)   ?2. Papillary fibroelastoma   ? ?PLAN:   ? ?In order of problems listed above: ? ?Status post closure now 10 years ago.  No further issues. ?Incidentally seen on follow-up imaging.  I have personally reviewed her last 2 echo studies and  this appears to be very small, located at the base of the RCC and NCC of the aortic valve.  No symptoms related to this.  Recommend repeat 2D echo to make sure no change.  Discussed plan with the patient today.  She has normal function of her aortic valve.  She has had no embolic events over the last decade.  Patient asymptomatic since undergoing transcatheter PFO closure. ? ?   ? ?Medication Adjustments/Labs and Tests Ordered: ?Current medicines are reviewed at length with the patient today.  Concerns regarding medicines are outlined above.  ?Orders Placed This Encounter  ?Procedures  ? EKG 12-Lead  ? ECHOCARDIOGRAM COMPLETE  ? ?No orders of the defined types were placed in this encounter. ? ? ?Patient Instructions  ?Medication Instructions:  ?Your physician recommends that you continue on your current medications as directed. Please refer to the Current Medication list given to you today. ? ?*If you need a refill on your cardiac medications before your next appointment, please call your pharmacy* ? ? ?Lab Work: ?NONE ?If you have labs (blood work) drawn today and your tests are completely normal, you will receive your results only by: ?MyChart Message (if you have MyChart) OR ?A paper copy in the mail ?If you have any lab test that is abnormal or we need to change your treatment, we will call you to review the  results. ? ? ?Testing/Procedures: ?ECHO ?Your physician has requested that you have an echocardiogram. Echocardiography is a painless test that uses sound waves to create images of your heart. It provides you

## 2021-10-25 NOTE — Patient Instructions (Signed)
Medication Instructions:  ?Your physician recommends that you continue on your current medications as directed. Please refer to the Current Medication list given to you today. ? ?*If you need a refill on your cardiac medications before your next appointment, please call your pharmacy* ? ? ?Lab Work: ?NONE ?If you have labs (blood work) drawn today and your tests are completely normal, you will receive your results only by: ?MyChart Message (if you have MyChart) OR ?A paper copy in the mail ?If you have any lab test that is abnormal or we need to change your treatment, we will call you to review the results. ? ? ?Testing/Procedures: ?ECHO ?Your physician has requested that you have an echocardiogram. Echocardiography is a painless test that uses sound waves to create images of your heart. It provides your doctor with information about the size and shape of your heart and how well your heart?s chambers and valves are working. This procedure takes approximately one hour. There are no restrictions for this procedure. ? ? ? ?Follow-Up: ?At [facility], you and your health needs are our priority.  As part of our continuing mission to provide you with exceptional heart care, we have created designated Provider Care Teams.  These Care Teams include your primary Cardiologist (physician) and Advanced Practice Providers (APPs -  Physician Assistants and Nurse Practitioners) who all work together to provide you with the care you need, when you need it. ? ?We recommend signing up for the patient portal called "MyChart".  Sign up information is provided on this After Visit Summary.  MyChart is used to connect with patients for Virtual Visits (Telemedicine).  Patients are able to view lab/test results, encounter notes, upcoming appointments, etc.  Non-urgent messages can be sent to your provider as well.   ?To learn more about what you can do with MyChart, go to [email or web address].   ? ?Your next appointment:   ?1  year(s) ? ?The format for your next appointment:   ?In Person ? ?Provider:   ?Sherren Mocha, MD   ? ?  ?

## 2021-11-12 ENCOUNTER — Ambulatory Visit (HOSPITAL_COMMUNITY): Payer: 59 | Attending: Internal Medicine

## 2021-11-12 DIAGNOSIS — D151 Benign neoplasm of heart: Secondary | ICD-10-CM | POA: Insufficient documentation

## 2021-11-12 DIAGNOSIS — Q2112 Patent foramen ovale: Secondary | ICD-10-CM | POA: Diagnosis present

## 2021-11-12 LAB — ECHOCARDIOGRAM COMPLETE
Area-P 1/2: 3.42 cm2
S' Lateral: 2.8 cm

## 2023-08-07 ENCOUNTER — Encounter: Payer: Self-pay | Admitting: Cardiovascular Disease

## 2023-08-07 ENCOUNTER — Ambulatory Visit: Payer: 59 | Attending: Cardiovascular Disease | Admitting: Cardiovascular Disease

## 2023-08-07 VITALS — BP 120/88 | HR 81 | Ht 65.0 in | Wt 168.2 lb

## 2023-08-07 DIAGNOSIS — D151 Benign neoplasm of heart: Secondary | ICD-10-CM

## 2023-08-07 DIAGNOSIS — R030 Elevated blood-pressure reading, without diagnosis of hypertension: Secondary | ICD-10-CM

## 2023-08-07 DIAGNOSIS — Q2112 Patent foramen ovale: Secondary | ICD-10-CM | POA: Diagnosis not present

## 2023-08-07 LAB — CBC

## 2023-08-07 NOTE — Progress Notes (Signed)
 Cardiology Office Note:    Date:  08/07/2023   ID:  MARGREE GIMBEL, DOB 01/08/1971, MRN [id number]  PCP:  Jame Maude FALCON, MD (Inactive)   [facility] Providers Cardiologist:  Ozell Fell, MD     Referring MD: No ref. provider found   Chief Complaint  Patient presents with   Follow-up    S/P PFO closure    History of Present Illness:    Morgan Shaffer is a 53 y.o. 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  She is doing pretty well from a cardiac perspective.  She denies chest pain, chest pressure, heart palpitations, or leg swelling.  She has no interval complaints.  She is not taking any medicines.  She has noticed that her blood pressure has been elevated at doctors visits.  She has not done any home blood pressure monitoring.  She feels that this is likely stress related from her job.  Her daughter is living in [place] and her son is now a printmaker at [employer].  Current Medications: No outpatient medications have been marked as taking for the 08/07/23 encounter (Office Visit) with Fell Ozell, MD.     Allergies:   Aspirin, Lisinopril, and Penicillins   ROS:   Please see the history of present illness.    All other systems reviewed and are negative.  EKGs/Labs/Other Studies Reviewed:    The following studies were reviewed today: Cardiac Studies & Procedures      ECHOCARDIOGRAM  ECHOCARDIOGRAM COMPLETE 11/12/2021  Narrative ECHOCARDIOGRAM REPORT    Patient Name:   Morgan Shaffer [facility] Date of Exam: 11/12/2021 Medical Rec #:  [id number]       Height:       65.0 in Accession #:    [id number]      Weight:        168.4 lb Date of Birth:  05-23-1971       BSA:          1.839 m Patient Age:    50 years        BP:           124/84 mmHg Patient Gender: F               HR:           71 bpm. Exam Location:  [address]  Procedure: 2D Echo, 3D Echo, Cardiac Doppler, Color Doppler and Strain Analysis  Indications:    Q21.12 PFO  History:        Patient has prior history of Echocardiogram examinations, most recent 06/10/2020. Stroke. PFO s/p 20mm Gore helix closure device. Anemia.  Sonographer:    Marshia Lawyer BS, RDCS Referring Phys: [phone] Lasalle Abee  IMPRESSIONS   1. Left ventricular ejection fraction, by estimation, is 60 to 65%. The left ventricle has normal function. The left ventricle has no regional wall motion abnormalities. Left ventricular diastolic parameters are consistent with Grade I diastolic dysfunction (impaired relaxation). The average left ventricular global longitudinal strain is -22.2 %. The global longitudinal strain is normal. 2. Normal RV free wall strain (-33.8%). Right ventricular systolic function is normal. The right ventricular size is normal. 3. Thickened interatrial  septum c/w prior Gore Helix PFO closure device placment (02/02/2012). 4. The mitral valve is abnormal. Mild to moderate mitral valve regurgitation. No evidence of mitral stenosis. 5. Small mobile aortic valve mass is again visualized, but appears unchanged in size. The aortic valve is normal in structure. Aortic valve regurgitation is not visualized. No aortic stenosis is present. 6. The inferior vena cava is normal in size with greater than 50% respiratory variability, suggesting right atrial pressure of 3 mmHg.  Comparison(s): No significant change from prior study. 06/10/20 EF 60-65%. GLS -24.8%.  FINDINGS Left Ventricle: Left ventricular ejection fraction, by estimation, is 60 to 65%. The left ventricle has normal function. The left ventricle has no regional wall motion abnormalities. The average  left ventricular global longitudinal strain is -22.2 %. The global longitudinal strain is normal. The left ventricular internal cavity size was normal in size. There is no left ventricular hypertrophy. Left ventricular diastolic parameters are consistent with Grade I diastolic dysfunction (impaired relaxation). Indeterminate filling pressures.  Right Ventricle: Normal RV free wall strain (-33.8%). The right ventricular size is normal. No increase in right ventricular wall thickness. Right ventricular systolic function is normal.  Left Atrium: Left atrial size was normal in size.  Right Atrium: Right atrial size was normal in size.  Pericardium: There is no evidence of pericardial effusion.  Mitral Valve: The mitral valve is abnormal. There is mild thickening of the anterior and posterior mitral valve leaflet(s). Mild to moderate mitral valve regurgitation. No evidence of mitral valve stenosis.  Tricuspid Valve: The tricuspid valve is normal in structure. Tricuspid valve regurgitation is not demonstrated. No evidence of tricuspid stenosis.  Aortic Valve: Small mobile aortic valve mass is again visualized, but appears unchanged in size. The aortic valve is normal in structure. Aortic valve regurgitation is not visualized. No aortic stenosis is present.  Pulmonic Valve: The pulmonic valve was normal in structure. Pulmonic valve regurgitation is not visualized. No evidence of pulmonic stenosis.  Aorta: The aortic root is normal in size and structure.  Venous: The inferior vena cava is normal in size with greater than 50% respiratory variability, suggesting right atrial pressure of 3 mmHg.  IAS/Shunts: No atrial level shunt detected by color flow Doppler. There is no evidence of a patent foramen ovale.   LEFT VENTRICLE PLAX 2D LVIDd:         4.40 cm LVIDs:         2.80 cm   2D Longitudinal Strain LV PW:         1.00 cm   2D Strain GLS (A2C):   -24.0 % LV IVS:        0.80 cm   2D Strain GLS  (A3C):   -22.0 % LVOT diam:     2.20 cm   2D Strain GLS (A4C):   -20.5 % LV SV:         87        2D Strain GLS Avg:     -22.2 % LV SV Index:   47 LVOT Area:     3.80 cm  3D Volume EF: 3D EF:        66 % LV EDV:       134 ml LV ESV:       45 ml LV SV:        89 ml  RIGHT VENTRICLE             IVC RV Basal diam:  3.00 cm  IVC diam: 1.50 cm RV S prime:     14.00 cm/s TAPSE (M-mode): 1.8 cm  LEFT ATRIUM             Index        RIGHT ATRIUM           Index LA diam:        3.90 cm 2.12 cm/m   RA Pressure: 3.00 mmHg LA Vol (A2C):   60.0 ml 32.63 ml/m  RA Area:     12.40 cm LA Vol (A4C):   40.0 ml 21.75 ml/m  RA Volume:   28.90 ml  15.72 ml/m LA Biplane Vol: 53.4 ml 29.04 ml/m AORTIC VALVE LVOT Vmax:   105.00 cm/s LVOT Vmean:  72.200 cm/s LVOT VTI:    0.228 m  AORTA Ao Root diam: 3.50 cm Ao Asc diam:  3.60 cm  MITRAL VALVE               TRICUSPID VALVE \                          Estimated RAP:  3.00 mmHg MV Decel Time: 222 msec MV E velocity: 71.80 cm/s  SHUNTS MV A velocity: 82.50 cm/s  Systemic VTI:  0.23 m MV E/A ratio:  0.87        Systemic Diam: 2.20 cm  Vinie Maxcy MD Electronically signed by Vinie Maxcy MD Signature Date/Time: 11/12/2021/2:21:57 PM    Final             EKG:        Recent Labs: No results found for requested labs within last 365 days.  Recent Lipid Panel    Component Value Date/Time   CHOL 167 03/23/2011 0606   TRIG 103 03/23/2011 0606   HDL 57 03/23/2011 0606   CHOLHDL 2.9 03/23/2011 0606   VLDL 21 03/23/2011 0606   LDLCALC 89 03/23/2011 0606     Risk Assessment/Calculations:                Physical Exam:    VS:  BP 120/88   Pulse 81   Ht 5' 5 (1.651 m)   Wt 168 lb 3.2 oz (76.3 kg)   SpO2 99%   BMI 27.99 kg/m     Wt Readings from Last 3 Encounters:  08/07/23 168 lb 3.2 oz (76.3 kg)  10/25/21 168 lb 6.4 oz (76.4 kg)  10/07/19 180 lb (81.6 kg)     GEN:  Well nourished, well developed in no acute  distress HEENT: Normal NECK: No JVD; No carotid bruits LYMPHATICS: No lymphadenopathy CARDIAC: RRR, no murmurs, rubs, gallops RESPIRATORY:  Clear to auscultation without rales, wheezing or rhonchi  ABDOMEN: Soft, non-tender, non-distended MUSCULOSKELETAL:  No edema; No deformity  SKIN: Warm and dry NEUROLOGIC:  Alert and oriented x 3 PSYCHIATRIC:  Normal affect   Assessment & Plan PFO (patent foramen ovale) No recurrent issues after remote device closure. Papillary fibroelastoma This has been stable across serial echo exams, last in 2023.  No clinical events noted.  Continue observation. White coat syndrome with high blood pressure without hypertension Lengthy discussion today about her blood pressure.  Her diastolic pressure is mildly elevated and she has a normal systolic reading today.  Again, she has been under a lot of stress at work.  She does not do any home blood pressure monitoring.  I went back and reviewed her previous readings, the majority of which are in appropriate  range but there are a few outliers where her blood pressure was greater than 140/90 mmHg.  I asked her to obtain a home blood pressure cuff and check her blood pressure about 3 times weekly.  I asked her to send those readings into me for review once she has 8-10 blood pressure readings.  I went over proper blood pressure measurement technique with her today, including that she sits down and rests for 5 minutes before checking it.  She follows regularly with her OB/GYN physician, but has not had any regular primary care follow-up.  She is going to establish with a primary care physician in the near future.  I am checking a CBC, metabolic panel, and lipid panel today.       Medication Adjustments/Labs and Tests Ordered: Current medicines are reviewed at length with the patient today.  Concerns regarding medicines are outlined above.  Orders Placed This Encounter  Procedures   CBC   Comprehensive metabolic panel    Lipid panel   EKG 12-Lead   No orders of the defined types were placed in this encounter.   Patient Instructions  Lab Work: CBC, CMET, Lipids today If you have labs (blood work) drawn today and your tests are completely normal, you will receive your results only by: MyChart Message (if you have MyChart) OR A paper copy in the mail If you have any lab test that is abnormal or we need to change your treatment, we will call you to review the results.  Follow-Up: At [facility], you and your health needs are our priority.  As part of our continuing mission to provide you with exceptional heart care, we have created designated Provider Care Teams.  These Care Teams include your primary Cardiologist (physician) and Advanced Practice Providers (APPs -  Physician Assistants and Nurse Practitioners) who all work together to provide you with the care you need, when you need it.  We recommend signing up for the patient portal called MyChart.  Sign up information is provided on this After Visit Summary.  MyChart is used to connect with patients for Virtual Visits (Telemedicine).  Patients are able to view lab/test results, encounter notes, upcoming appointments, etc.  Non-urgent messages can be sent to your provider as well.   To learn more about what you can do with MyChart, go to [email or web address].    Your next appointment:   1 year(s)  Provider:   Ozell Fell, MD       Other Instructions **Please send BP readings in once 7-10 readings are obtained **Recommend Omron cuff (bicep)        Signed, Ozell Fell, MD  08/07/2023 3:35 PM    [facility]

## 2023-08-07 NOTE — Patient Instructions (Signed)
 Lab Work: CBC, CMET, Lipids today If you have labs (blood work) drawn today and your tests are completely normal, you will receive your results only by: MyChart Message (if you have MyChart) OR A paper copy in the mail If you have any lab test that is abnormal or we need to change your treatment, we will call you to review the results.  Follow-Up: At [facility], you and your health needs are our priority.  As part of our continuing mission to provide you with exceptional heart care, we have created designated Provider Care Teams.  These Care Teams include your primary Cardiologist (physician) and Advanced Practice Providers (APPs -  Physician Assistants and Nurse Practitioners) who all work together to provide you with the care you need, when you need it.  We recommend signing up for the patient portal called MyChart.  Sign up information is provided on this After Visit Summary.  MyChart is used to connect with patients for Virtual Visits (Telemedicine).  Patients are able to view lab/test results, encounter notes, upcoming appointments, etc.  Non-urgent messages can be sent to your provider as well.   To learn more about what you can do with MyChart, go to [email or web address].    Your next appointment:   1 year(s)  Provider:   Ozell Fell, MD       Other Instructions **Please send BP readings in once 7-10 readings are obtained **Recommend Omron cuff (bicep)

## 2023-08-07 NOTE — Assessment & Plan Note (Signed)
 No recurrent issues after remote device closure.

## 2023-08-08 LAB — COMPREHENSIVE METABOLIC PANEL
ALT: 8 [IU]/L (ref 0–32)
AST: 17 [IU]/L (ref 0–40)
Albumin: 4.4 g/dL (ref 3.8–4.9)
Alkaline Phosphatase: 105 [IU]/L (ref 44–121)
BUN/Creatinine Ratio: 8 — ABNORMAL LOW (ref 9–23)
BUN: 8 mg/dL (ref 6–24)
Bilirubin Total: 0.3 mg/dL (ref 0.0–1.2)
CO2: 22 mmol/L (ref 20–29)
Calcium: 9.5 mg/dL (ref 8.7–10.2)
Chloride: 104 mmol/L (ref 96–106)
Creatinine, Ser: 1 mg/dL (ref 0.57–1.00)
Globulin, Total: 3 g/dL (ref 1.5–4.5)
Glucose: 113 mg/dL — ABNORMAL HIGH (ref 70–99)
Potassium: 3.9 mmol/L (ref 3.5–5.2)
Sodium: 140 mmol/L (ref 134–144)
Total Protein: 7.4 g/dL (ref 6.0–8.5)
eGFR: 68 mL/min/{1.73_m2} (ref >59)

## 2023-08-08 LAB — CBC
Hematocrit: 31.5 % — ABNORMAL LOW (ref 34.0–46.6)
Hemoglobin: 8.2 g/dL — ABNORMAL LOW (ref 11.1–15.9)
MCH: 17.1 pg — ABNORMAL LOW (ref 26.6–33.0)
MCHC: 26 g/dL — ABNORMAL LOW (ref 31.5–35.7)
MCV: 66 fL — ABNORMAL LOW (ref 79–97)
Platelets: 523 10*3/uL — ABNORMAL HIGH (ref 150–450)
RBC: 4.8 x10E6/uL (ref 3.77–5.28)
RDW: 20.7 % — ABNORMAL HIGH (ref 11.7–15.4)
WBC: 3.8 10*3/uL (ref 3.4–10.8)

## 2023-08-08 LAB — LIPID PANEL
Chol/HDL Ratio: 4.2 {ratio} (ref 0.0–4.4)
Cholesterol, Total: 154 mg/dL (ref 100–199)
HDL: 37 mg/dL — ABNORMAL LOW (ref >39)
LDL Chol Calc (NIH): 102 mg/dL — ABNORMAL HIGH (ref 0–99)
Triglycerides: 75 mg/dL (ref 0–149)
VLDL Cholesterol Cal: 15 mg/dL (ref 5–40)

## 2024-02-22 ENCOUNTER — Encounter: Payer: Self-pay | Admitting: Optometry

## 2024-02-22 DIAGNOSIS — H532 Diplopia: Secondary | ICD-10-CM

## 2024-03-01 ENCOUNTER — Other Ambulatory Visit: Payer: Self-pay | Admitting: Optometry

## 2024-03-01 DIAGNOSIS — H532 Diplopia: Secondary | ICD-10-CM

## 2024-03-11 ENCOUNTER — Telehealth: Payer: Self-pay | Admitting: Cardiovascular Disease

## 2024-03-11 NOTE — Telephone Encounter (Signed)
 Jon with [facility] is calling for pt's serial and model number for her septal device, she is having an MRI and they need they information before [facility] can schedule. Please advise.   She said when you call back, hit 0 and tell whoever answers that Jon is waiting on your call.

## 2024-03-11 NOTE — Telephone Encounter (Signed)
 Spoke with angela, aware the device is 20 mm Gore Helex device. She reports radiology wants the serial number. No serial number in chart and procedure was done in 2013. Will have to reach out to the rep for more information.

## 2024-03-11 NOTE — Telephone Encounter (Signed)
 Spoke with Morgan Shaffer, aware serial number is [id number].

## 2024-04-16 ENCOUNTER — Ambulatory Visit
Admission: RE | Admit: 2024-04-16 | Discharge: 2024-04-16 | Disposition: A | Discharge status: Home / Self Care | Source: Ambulatory Visit | Attending: Optometry | Admitting: Optometry

## 2024-04-16 DIAGNOSIS — H532 Diplopia: Secondary | ICD-10-CM

## 2024-04-16 MED ORDER — GADOBUTROL 1 MMOL/ML IV SOLN
7.5000 mL | Freq: Once | INTRAVENOUS | Status: AC | PRN
  Administered 2024-04-16: 7.5 mL via INTRAVENOUS

## 2024-06-11 ENCOUNTER — Other Ambulatory Visit: Payer: Self-pay | Admitting: Obstetrics and Gynecology

## 2024-06-11 DIAGNOSIS — Z1231 Encounter for screening mammogram for malignant neoplasm of breast: Secondary | ICD-10-CM
